# Patient Record
Sex: Female | Born: 1980 | Hispanic: No | Marital: Married | Smoking: Never smoker
Health system: Southern US, Community
[De-identification: ages and names within clinical notes are randomized; demographics above are authoritative.]

## PROBLEM LIST (undated history)

## (undated) DIAGNOSIS — M51369 Other intervertebral disc degeneration, lumbar region without mention of lumbar back pain or lower extremity pain: Secondary | ICD-10-CM

## (undated) DIAGNOSIS — M5136 Other intervertebral disc degeneration, lumbar region: Secondary | ICD-10-CM

## (undated) HISTORY — PX: TONSILLECTOMY: SUR1361

---

## 2000-06-07 ENCOUNTER — Inpatient Hospital Stay (HOSPITAL_COMMUNITY): Admission: AD | Admit: 2000-06-07 | Discharge: 2000-06-08 | Payer: Self-pay | Admitting: Obstetrics & Gynecology

## 2000-08-10 ENCOUNTER — Encounter: Payer: Self-pay | Admitting: *Deleted

## 2000-08-10 ENCOUNTER — Inpatient Hospital Stay (HOSPITAL_COMMUNITY): Admission: AD | Admit: 2000-08-10 | Discharge: 2000-08-10 | Payer: Self-pay | Admitting: Obstetrics

## 2003-06-06 ENCOUNTER — Inpatient Hospital Stay (HOSPITAL_COMMUNITY): Admission: AD | Admit: 2003-06-06 | Discharge: 2003-06-09 | Payer: Self-pay | Admitting: Obstetrics and Gynecology

## 2007-01-28 ENCOUNTER — Ambulatory Visit: Payer: Self-pay | Admitting: *Deleted

## 2007-01-28 ENCOUNTER — Inpatient Hospital Stay (HOSPITAL_COMMUNITY): Admission: AD | Admit: 2007-01-28 | Discharge: 2007-01-30 | Payer: Self-pay | Admitting: Gynecology

## 2011-04-20 LAB — HEPATITIS B SURFACE ANTIGEN: Hepatitis B Surface Ag: NEGATIVE

## 2011-04-20 LAB — CBC
HCT: 34.8 — ABNORMAL LOW
Hemoglobin: 11.8 — ABNORMAL LOW
MCHC: 33.8
MCV: 77.1 — ABNORMAL LOW
Platelets: 197
RBC: 4.51
RDW: 14.5 — ABNORMAL HIGH
WBC: 10.5

## 2011-04-20 LAB — DIFFERENTIAL
Basophils Absolute: 0
Basophils Relative: 0
Eosinophils Absolute: 0
Eosinophils Relative: 0
Lymphocytes Relative: 18
Lymphs Abs: 1.9
Monocytes Absolute: 0.6
Monocytes Relative: 6
Neutro Abs: 7.9 — ABNORMAL HIGH
Neutrophils Relative %: 76

## 2011-04-20 LAB — TYPE AND SCREEN
ABO/RH(D): A POS
Antibody Screen: POSITIVE
Donor AG Type: NEGATIVE

## 2011-04-20 LAB — URINALYSIS, ROUTINE W REFLEX MICROSCOPIC
Bilirubin Urine: NEGATIVE
Nitrite: NEGATIVE
Specific Gravity, Urine: 1.01
Urobilinogen, UA: 0.2
pH: 6.5

## 2011-04-20 LAB — RPR: RPR Ser Ql: NONREACTIVE

## 2011-04-20 LAB — RAPID URINE DRUG SCREEN, HOSP PERFORMED
Barbiturates: NOT DETECTED
Opiates: NOT DETECTED
Tetrahydrocannabinol: NOT DETECTED

## 2011-04-20 LAB — RAPID HIV SCREEN (WH-MAU): Rapid HIV Screen: NONREACTIVE

## 2021-09-25 ENCOUNTER — Emergency Department (HOSPITAL_BASED_OUTPATIENT_CLINIC_OR_DEPARTMENT_OTHER)
Admission: EM | Admit: 2021-09-25 | Discharge: 2021-09-25 | Disposition: A | Discharge status: Home / Self Care | Payer: Self-pay | Attending: Emergency Medicine | Admitting: Emergency Medicine

## 2021-09-25 ENCOUNTER — Emergency Department (HOSPITAL_BASED_OUTPATIENT_CLINIC_OR_DEPARTMENT_OTHER): Payer: Self-pay

## 2021-09-25 ENCOUNTER — Encounter (HOSPITAL_BASED_OUTPATIENT_CLINIC_OR_DEPARTMENT_OTHER): Payer: Self-pay | Admitting: Urology

## 2021-09-25 DIAGNOSIS — S8992XA Unspecified injury of left lower leg, initial encounter: Secondary | ICD-10-CM | POA: Diagnosis not present

## 2021-09-25 DIAGNOSIS — S199XXA Unspecified injury of neck, initial encounter: Secondary | ICD-10-CM | POA: Insufficient documentation

## 2021-09-25 DIAGNOSIS — S8991XA Unspecified injury of right lower leg, initial encounter: Secondary | ICD-10-CM | POA: Insufficient documentation

## 2021-09-25 DIAGNOSIS — S79911A Unspecified injury of right hip, initial encounter: Secondary | ICD-10-CM | POA: Insufficient documentation

## 2021-09-25 DIAGNOSIS — S4991XA Unspecified injury of right shoulder and upper arm, initial encounter: Secondary | ICD-10-CM | POA: Insufficient documentation

## 2021-09-25 DIAGNOSIS — S299XXA Unspecified injury of thorax, initial encounter: Secondary | ICD-10-CM | POA: Insufficient documentation

## 2021-09-25 DIAGNOSIS — Y92410 Unspecified street and highway as the place of occurrence of the external cause: Secondary | ICD-10-CM | POA: Insufficient documentation

## 2021-09-25 HISTORY — DX: Other intervertebral disc degeneration, lumbar region without mention of lumbar back pain or lower extremity pain: M51.369

## 2021-09-25 HISTORY — DX: Other intervertebral disc degeneration, lumbar region: M51.36

## 2021-09-25 MED ORDER — LIDOCAINE 5 % EX PTCH: 1.0000 | MEDICATED_PATCH | CUTANEOUS | 0 refills | Status: DC

## 2021-09-25 MED ORDER — METHOCARBAMOL 500 MG PO TABS: 500.0000 mg | ORAL_TABLET | Freq: Two times a day (BID) | ORAL | 0 refills | Status: DC

## 2021-09-25 MED ORDER — METHOCARBAMOL 500 MG PO TABS
500.0000 mg | ORAL_TABLET | Freq: Once | ORAL | Status: AC
  Administered 2021-09-25: 500 mg via ORAL
  Filled 2021-09-25: qty 1

## 2021-09-25 MED ORDER — LIDOCAINE 5 % EX PTCH
1.0000 | MEDICATED_PATCH | CUTANEOUS | Status: DC
  Administered 2021-09-25: 1 via TRANSDERMAL
  Filled 2021-09-25: qty 1

## 2021-09-25 MED ORDER — IBUPROFEN 400 MG PO TABS
600.0000 mg | ORAL_TABLET | Freq: Once | ORAL | Status: AC
  Administered 2021-09-25: 600 mg via ORAL
  Filled 2021-09-25: qty 1
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
## 2021-10-11 ENCOUNTER — Encounter (HOSPITAL_BASED_OUTPATIENT_CLINIC_OR_DEPARTMENT_OTHER): Payer: Self-pay | Admitting: Emergency Medicine

## 2021-10-11 ENCOUNTER — Emergency Department (HOSPITAL_BASED_OUTPATIENT_CLINIC_OR_DEPARTMENT_OTHER): Payer: Self-pay

## 2021-10-11 ENCOUNTER — Emergency Department (HOSPITAL_BASED_OUTPATIENT_CLINIC_OR_DEPARTMENT_OTHER)
Admission: EM | Admit: 2021-10-11 | Discharge: 2021-10-11 | Disposition: A | Discharge status: Home / Self Care | Payer: Self-pay | Attending: Emergency Medicine | Admitting: Emergency Medicine

## 2021-10-11 ENCOUNTER — Other Ambulatory Visit: Payer: Self-pay

## 2021-10-11 DIAGNOSIS — R0602 Shortness of breath: Secondary | ICD-10-CM | POA: Insufficient documentation

## 2021-10-11 DIAGNOSIS — S4991XA Unspecified injury of right shoulder and upper arm, initial encounter: Secondary | ICD-10-CM | POA: Diagnosis not present

## 2021-10-11 DIAGNOSIS — Y92410 Unspecified street and highway as the place of occurrence of the external cause: Secondary | ICD-10-CM | POA: Insufficient documentation

## 2021-10-11 DIAGNOSIS — S199XXA Unspecified injury of neck, initial encounter: Secondary | ICD-10-CM | POA: Insufficient documentation

## 2021-10-11 DIAGNOSIS — R051 Acute cough: Secondary | ICD-10-CM | POA: Diagnosis not present

## 2021-10-11 DIAGNOSIS — Z20822 Contact with and (suspected) exposure to covid-19: Secondary | ICD-10-CM | POA: Diagnosis not present

## 2021-10-11 DIAGNOSIS — R0789 Other chest pain: Secondary | ICD-10-CM

## 2021-10-11 DIAGNOSIS — S299XXA Unspecified injury of thorax, initial encounter: Secondary | ICD-10-CM | POA: Diagnosis present

## 2021-10-11 LAB — CBC WITH DIFFERENTIAL/PLATELET
Abs Immature Granulocytes: 0.01 10*3/uL (ref 0.00–0.07)
Basophils Absolute: 0.1 10*3/uL (ref 0.0–0.1)
Basophils Relative: 1 %
Eosinophils Absolute: 0.1 10*3/uL (ref 0.0–0.5)
Eosinophils Relative: 2 %
HCT: 36.1 % (ref 36.0–46.0)
Hemoglobin: 12.4 g/dL (ref 12.0–15.0)
Immature Granulocytes: 0 %
Lymphocytes Relative: 36 %
Lymphs Abs: 2.7 10*3/uL (ref 0.7–4.0)
MCH: 27.9 pg (ref 26.0–34.0)
MCHC: 34.3 g/dL (ref 30.0–36.0)
MCV: 81.1 fL (ref 80.0–100.0)
Monocytes Absolute: 0.7 10*3/uL (ref 0.1–1.0)
Monocytes Relative: 10 %
Neutro Abs: 3.8 10*3/uL (ref 1.7–7.7)
Neutrophils Relative %: 51 %
Platelets: 249 10*3/uL (ref 150–400)
RBC: 4.45 MIL/uL (ref 3.87–5.11)
RDW: 12.9 % (ref 11.5–15.5)
WBC: 7.5 10*3/uL (ref 4.0–10.5)
nRBC: 0 % (ref 0.0–0.2)

## 2021-10-11 LAB — BASIC METABOLIC PANEL
Anion gap: 7 (ref 5–15)
BUN: 14 mg/dL (ref 6–20)
CO2: 23 mmol/L (ref 22–32)
Calcium: 8.6 mg/dL — ABNORMAL LOW (ref 8.9–10.3)
Chloride: 108 mmol/L (ref 98–111)
Creatinine, Ser: 0.67 mg/dL (ref 0.44–1.00)
GFR, Estimated: >60 mL/min (ref >60)
Glucose, Bld: 127 mg/dL — ABNORMAL HIGH (ref 70–99)
Potassium: 3.7 mmol/L (ref 3.5–5.1)
Sodium: 138 mmol/L (ref 135–145)

## 2021-10-11 LAB — RESP PANEL BY RT-PCR (FLU A&B, COVID) ARPGX2
Influenza A by PCR: NEGATIVE
Influenza B by PCR: NEGATIVE
SARS Coronavirus 2 by RT PCR: NEGATIVE

## 2021-10-11 MED ORDER — NAPROXEN 500 MG PO TABS: 500.0000 mg | ORAL_TABLET | Freq: Two times a day (BID) | ORAL | 0 refills | Status: DC

## 2021-10-11 MED ORDER — IOHEXOL 300 MG/ML  SOLN
80.0000 mL | Freq: Once | INTRAMUSCULAR | Status: AC | PRN
  Administered 2021-10-11: 80 mL via INTRAVENOUS

## 2021-10-11 MED ORDER — KETOROLAC TROMETHAMINE 30 MG/ML IJ SOLN
30.0000 mg | Freq: Once | INTRAMUSCULAR | Status: AC
  Administered 2021-10-11: 30 mg via INTRAVENOUS
  Filled 2021-10-11: qty 1

## 2021-10-11 MED ORDER — METHOCARBAMOL 500 MG PO TABS: 500.0000 mg | ORAL_TABLET | Freq: Two times a day (BID) | ORAL | 0 refills | Status: DC
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
## 2021-10-13 ENCOUNTER — Ambulatory Visit: Payer: Self-pay | Attending: Emergency Medicine | Admitting: Physical Therapy

## 2021-10-13 ENCOUNTER — Encounter: Payer: Self-pay | Admitting: Physical Therapy

## 2021-10-13 DIAGNOSIS — M542 Cervicalgia: Secondary | ICD-10-CM | POA: Insufficient documentation

## 2021-10-13 DIAGNOSIS — R29898 Other symptoms and signs involving the musculoskeletal system: Secondary | ICD-10-CM | POA: Insufficient documentation

## 2021-10-13 DIAGNOSIS — R252 Cramp and spasm: Secondary | ICD-10-CM | POA: Insufficient documentation

## 2021-10-13 NOTE — Patient Instructions (Signed)
Access Code: [facility] ?URL: [email or web address] ?Date: 10/13/2021 ?Prepared by: Raynelle Fanning ? ?Exercises ?- Seated Cervical Rotation AROM  - 2 x daily - 7 x weekly - 1 sets - 10 reps - 5 sec hold ?- Seated Cervical Sidebending AROM  - 2 x daily - 7 x weekly - 1 sets - 10 reps - 5 sec hold ?- Seated Cervical Flexion AROM  - 2 x daily - 7 x weekly - 1 sets - 10 reps - 5 sec hold ?- Seated Cervical Extension AROM  - 2 x daily - 7 x weekly - 1 sets - 10 reps - 5 sec hold ?- Seated Cervical Retraction  - 2 x daily - 7 x weekly - 1 sets - 10 reps - 5 sec hold ?- Doorway Pec Stretch at 60 Degrees Abduction with Arm Straight  - 2 x daily - 7 x weekly - 1 sets - 3 reps - 30 sec hold ?- Doorway Pec Stretch at 60 Elevation  - 2 x daily - 7 x weekly - 1 sets - 3 reps - 30 sec hold ?
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
## 2021-10-16 ENCOUNTER — Ambulatory Visit: Payer: Self-pay | Admitting: Rehabilitative and Restorative Service Providers"

## 2021-10-16 ENCOUNTER — Encounter: Payer: Self-pay | Admitting: Physical Therapy

## 2021-10-16 ENCOUNTER — Ambulatory Visit: Payer: Self-pay | Admitting: Physical Therapy

## 2021-10-16 DIAGNOSIS — R29898 Other symptoms and signs involving the musculoskeletal system: Secondary | ICD-10-CM

## 2021-10-16 DIAGNOSIS — R252 Cramp and spasm: Secondary | ICD-10-CM

## 2021-10-16 DIAGNOSIS — M542 Cervicalgia: Secondary | ICD-10-CM

## 2021-10-16 NOTE — Therapy (Signed)
[place] [facility] [address] ?[place], [place], [address] ?Phone: [phone]   Fax:  [phone] ? ?Physical Therapy Treatment ? ?Patient Details  ?Name: Amanda French ?MRN: [id number] ?Date of Birth: [birth year] ?Referring Provider (PT): Calvert Cantor, MD ? ? ?Encounter Date: 10/16/2021 ? ? PT End of Session - 10/16/21 1430   ? ? Visit Number 2   ? Date for PT Re-Evaluation 12/08/21   ? PT Start Time [id number]   ? PT Stop Time 1402   ? PT Time Calculation (min) 41 min   ? Activity Tolerance Patient tolerated treatment well   ? Behavior During Therapy [facility] for tasks assessed/performed   ? ?  ?  ? ?  ? ? ?Past Medical History:  ?Diagnosis Date  ? DDD (degenerative disc disease), lumbar   ? ? ?Past Surgical History:  ?Procedure Laterality Date  ? TONSILLECTOMY    ? ? ?There were no vitals filed for this visit. ? ? Subjective Assessment - 10/16/21 1325   ? ? Subjective Still having a lot of severe pain when coughing and sneezing, mm relaxers help the most   ? Patient Stated Goals to get rid of pain   ? Currently in Pain? Yes   ? Pain Score 4    ? ?  ?  ? ?  ? ? ? ? ? ? ? ? ? ? ? ? ? ? ? ? ? ? ? ? [facility] Adult PT Treatment/Exercise - 10/16/21 0001   ? ?  ? Neck Exercises: Machines for Strengthening  ? UBE (Upper Arm Bike) L1 3 min forward, 3 min backward   ?  ? Neck Exercises: Seated  ? Neck Retraction 15 reps   ? Neck Retraction Limitations 3 second   ? Cervical Rotation 10 reps   ? Cervical Rotation Limitations with chin tuck   ? Lateral Flexion 10 reps   ? Lateral Flexion Limitations with chin tuck   ? Other Seated Exercise thoracic excursions 1x10 all directions   ?  ? Neck Exercises: Supine  ? Other Supine Exercise thoracic extension stretch over foam roll 1x60 seconds   ?  ? Manual Therapy  ? Manual Therapy Soft tissue mobilization   ? Soft tissue mobilization suboccipitals and cervical paraspinals   ? ?  ?  ? ?  ? ? ? ? ? ? ? ? ? ? PT Education - 10/16/21 1430   ? ?  Education Details education on anatomy of area, how PT interventions can help, exercise form, HEP updates, heat vs ice   ? Person(s) Educated Patient   ? Methods Explanation   ? Comprehension Verbalized understanding   ? ?  ?  ? ?  ? ? ? PT Short Term Goals - 10/13/21 1318   ? ?  ? PT SHORT TERM GOAL #1  ? Title Ind with initial HEP   ? Time 2   ? Period Weeks   ? Status New   ? Target Date 10/27/21   ?  ? PT SHORT TERM GOAL #2  ? Title Complete neck assessment   ? Time 1   ? Period Weeks   ? Status New   ? Target Date 10/20/21   ? ?  ?  ? ?  ? ? ? ? PT Long Term Goals - 10/13/21 1320   ? ?  ? PT LONG TERM GOAL #1  ? Title ind with HEP and progression   ? Time 8   ?  Period Weeks   ? Status New   ? Target Date 12/08/21   ?  ? PT LONG TERM GOAL #2  ? Title decreased neck and chest pain by >= 90% with ADLs including lifting and carrying.   ? Time 8   ? Period Weeks   ? Status New   ?  ? PT LONG TERM GOAL #3  ? Title improved neck ROM to [facility] to peform ADLS   ? Time 8   ? Period Weeks   ? Status New   ?  ? PT LONG TERM GOAL #4  ? Title Patient able to sleep without feeling N/T in bil UE   ? Time 8   ? Period Weeks   ? Status New   ? ?  ?  ? ?  ? ? ? ? ? ? ? ? Plan - 10/16/21 1430   ? ? Clinical Impression Statement Amanda French arrives today, still having a lot of pain. We warmed up on the UBE, then worked on cervical and thoracic mobility, she had a lot of questions about anatomy of area affected and why she is having so much pain when coughing. Finished with STM, also updated HEP. Will continue to progress as able and tolerated.   ? Examination-Activity Limitations Carry;Lift   ? Stability/Clinical Decision Making Stable/Uncomplicated   ? Clinical Decision Making Low   ? Rehab Potential Excellent   ? PT Frequency 2x / week   ? PT Duration 8 weeks   ? PT Treatment/Interventions ADLs/Self Care Home Management;Cryotherapy;Electrical Stimulation;Moist Heat;Traction;Neuromuscular re-education;Therapeutic exercise;Therapeutic  activities;Patient/family education;Manual techniques;Dry needling;Taping   ? PT Next Visit Plan continue working on general mobility, check ribs? progress movement in general   ? PT Home Exercise Plan [facility]   ? Consulted and Agree with Plan of Care Patient   ? ?  ?  ? ?  ? ? ?Patient will benefit from skilled therapeutic intervention in order to improve the following deficits and impairments:  Decreased range of motion, Impaired UE functional use, Increased muscle spasms, Pain, Decreased activity tolerance, Decreased strength, Impaired flexibility ? ?Visit Diagnosis: ?Cervicalgia ? ?Cramp and spasm ? ?Other symptoms and signs involving the musculoskeletal system ? ? ? ? ?Problem List ?There are no problems to display for this patient. ? ?Costella Schwarz U PT, DPT, PN2  ? ?Supplemental Physical Therapist ?[place]  ? ? ? ? ? ?[place] ?[place] ?[address] ?[place], [place], [address] ?Phone: [phone]   Fax:  [phone] ? ?Name: Amanda French ?MRN: [id number] ?Date of Birth: [birth year] ? ? ? ?

## 2021-10-28 ENCOUNTER — Encounter: Payer: Self-pay | Admitting: Physical Therapy

## 2021-10-28 ENCOUNTER — Ambulatory Visit: Payer: Self-pay | Admitting: Physical Therapy

## 2021-10-28 DIAGNOSIS — R29898 Other symptoms and signs involving the musculoskeletal system: Secondary | ICD-10-CM

## 2021-10-28 DIAGNOSIS — M542 Cervicalgia: Secondary | ICD-10-CM

## 2021-10-28 DIAGNOSIS — R252 Cramp and spasm: Secondary | ICD-10-CM
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
## 2021-10-29 ENCOUNTER — Encounter: Payer: Self-pay | Admitting: Physical Therapy

## 2021-10-29 ENCOUNTER — Ambulatory Visit: Payer: Self-pay | Admitting: Physical Therapy

## 2021-10-29 DIAGNOSIS — M542 Cervicalgia: Secondary | ICD-10-CM

## 2021-10-29 DIAGNOSIS — R252 Cramp and spasm: Secondary | ICD-10-CM

## 2021-10-29 DIAGNOSIS — R29898 Other symptoms and signs involving the musculoskeletal system: Secondary | ICD-10-CM

## 2021-10-29 NOTE — Therapy (Signed)
[place] ?[facility] ?[id number] ?[place], [place], [address] ?Phone: [phone]   Fax:  [phone] ? ?Physical Therapy Treatment ? ?Patient Details  ?Name: Amanda French ?MRN: [id number] ?Date of Birth: 1981-06-30 ?Referring Provider (PT): Susy Frizzleharles Sheldon, MD ? ? ?Encounter Date: 10/29/2021 ? ? PT End of Session - 10/29/21 1537   ? ? Visit Number 4   ? Date for PT Re-Evaluation 12/08/21   ? PT Start Time 1322   ? PT Stop Time 1416   heat not included in billing  ? PT Time Calculation (min) 54 min   ? Activity Tolerance Patient tolerated treatment well   ? Behavior [facility] for tasks assessed/performed   ? ?  ?  ? ?  ? ? ?Past Medical History:  ?Diagnosis Date  ? DDD (degenerative disc disease), lumbar   ? ? ?Past Surgical History:  ?Procedure Laterality Date  ? TONSILLECTOMY    ? ? ?There were no vitals filed for this visit. ? ? Subjective Assessment - 10/29/21 1324   ? ? Subjective Didn't have a chance to try new exercises yet, very busy yesterday; seeing Dr. Denyse Amassorey tomorrow. The only thing that helps sometimes is when she takes a warm shower. Would like to know if any vitamins like B6 or B12 will help her pain.   ? Patient is accompained by: Interpreter   ? Patient Stated Goals to get rid of pain   ? Currently in Pain? Yes   ? Pain Score 4    ? Pain Location --   neck, shoulders, hand  ? Pain Orientation Right;Left   ? ?  ?  ? ?  ? ? ? ? ? ? ? ? ? ? ? ? ? ? ? ? ? ? ? ? [facility] Adult PT Treatment/Exercise - 10/29/21 0001   ? ?  ? Neck Exercises: Supine  ? Other Supine Exercise pec stretches on door way 2x15 seconds   ?  ? Neck Exercises: Stretches  ? Upper Trapezius Stretch Left;Right;3 reps;30 seconds   ?  ? Manual Therapy  ? Manual Therapy Soft tissue mobilization;Myofascial release;Manual Traction   ? Soft tissue mobilization suboccipital release, cervical paraspinals, B upper traps and scalenes   ? Myofascial Release L UT MFR, suboccipital release   ? Manual  Traction gentle manual traction to cervical spine   ? ?  ?  ? ?  ?UBE L3 x3 min forward, 3 min backward  ? ? ? ? ? ? ? ? ? PT Education - 10/29/21 1536   ? ? Education Details Spent a lot of time discussing role of chronic guarding and stress in chronic muscle spasm and pain as well as anatomy of region involved.   ? Person(s) Educated Patient;Child(ren)   ? Methods Explanation   ? Comprehension Verbalized understanding   ? ?  ?  ? ?  ? ? ? PT Short Term Goals - 10/28/21 1446   ? ?  ? PT SHORT TERM GOAL #1  ? Title Ind with initial HEP   ? Time 2   ? Period Weeks   ? Status New   ?  ? PT SHORT TERM GOAL #2  ? Title Complete neck assessment   ? Period Weeks   ? Status New   ? Target Date 10/20/21   ? ?  ?  ? ?  ? ? ? ? PT Long Term Goals - 10/28/21 1446   ? ?  ? PT  LONG TERM GOAL #1  ? Title ind with HEP and progression   ? Time 8   ? Period Weeks   ? Status New   ? Target Date 12/08/21   ?  ? PT LONG TERM GOAL #2  ? Title decreased neck and chest pain by >= 90% with ADLs including lifting and carrying.   ? Time 8   ? Period Weeks   ? Status New   ?  ? PT LONG TERM GOAL #3  ? Title improved neck ROM to [facility] to peform ADLS   ? Time 8   ? Period Weeks   ? Status New   ?  ? PT LONG TERM GOAL #4  ? Title Patient able to sleep without feeling N/T in bil UE   ? Time 8   ? Period Weeks   ? Status New   ?  ? PT LONG TERM GOAL #5  ? Title BUE strength to improve to 5/5 and she will be able to complete functional cooking and home keeping tasks with pain no more than 4/10   ? Time 8   ? Period Weeks   ? Status New   ? ?  ?  ? ?  ? ? ? ? ? ? ? ? Plan - 10/29/21 1537   ? ? Clinical Impression Statement [name] arrives today still having quite a bit of pain in her neck, shoulders, and arms; we warmed up on the UBE, from there I focused today?s session on manual techniques and interventions. Tried manual cervical traction today, this was limited by suboccipital and cervical paraspinal soft tissue pain so I shifted gears to MFR, STM,  and mm stretching. Able to get some release but not enough to reduce pain in sitting, so put her on a heat pack and followed this with further STM to UTs, levators, cervical paraspinals, and scalenes. Able to get further mm release and improvement in pain when we got her back to sitting from supine. Also added 3 way doorway pec stretch to HEP. Spent a lot of time discussing role of chronic guarding and stress in chronic muscle spasm and pain as well as anatomy of region involved. I think we found some interventions that will really help her moving forward, but I do think she?ll benefit from DN as well- will reach out to [facility] clinic as they are familiar with that location.   ? Examination-Activity Limitations Carry;Lift   ? Stability/Clinical Decision Making Stable/Uncomplicated   ? Clinical Decision Making Low   ? Rehab Potential Excellent   ? PT Frequency 2x / week   ? PT Duration 8 weeks   ? PT Treatment/Interventions ADLs/Self Care Home Management;Cryotherapy;Electrical Stimulation;Moist Heat;Traction;Neuromuscular re-education;Therapeutic exercise;Therapeutic activities;Patient/family education;Manual techniques;Dry needling;Taping   ? PT Next Visit Plan continue working on general mobility, check ribs? progress movement in general, tolerates activities better in supine for now   ? PT Home Exercise Plan [facility]   ? Consulted and Agree with Plan of Care Patient   ? ?  ?  ? ?  ? ? ?Patient will benefit from skilled therapeutic intervention in order to improve the following deficits and impairments:  Decreased range of motion, Impaired UE functional use, Increased muscle spasms, Pain, Decreased activity tolerance, Decreased strength, Impaired flexibility ? ?Visit Diagnosis: ?Cervicalgia ? ?Cramp and spasm ? ?Other symptoms and signs involving the musculoskeletal system ? ? ? ? ?Problem List ?There are no problems to display for this patient. ? ?Lerry Liner PT, DPT,  PN2  ? ?Supplemental Physical  Therapist ?[place]  ? ? ? ? ? ?[place] ?[facility] ?[address] ?[place], [place], [address] ?Phone: [phone]   Fax:  [phone] ? ?Name: Mushka Laconte ?MRN: [id number] ?Date of Birth: 1980/08/01 ? ? ? ?

## 2021-10-30 ENCOUNTER — Encounter: Payer: Self-pay | Admitting: Family Medicine

## 2021-10-30 ENCOUNTER — Ambulatory Visit (INDEPENDENT_AMBULATORY_CARE_PROVIDER_SITE_OTHER): Payer: Self-pay

## 2021-10-30 ENCOUNTER — Ambulatory Visit (INDEPENDENT_AMBULATORY_CARE_PROVIDER_SITE_OTHER): Payer: Self-pay | Admitting: Family Medicine

## 2021-10-30 VITALS — BP 110/80 | HR 82 | Ht 64.0 in | Wt 179.6 lb

## 2021-10-30 DIAGNOSIS — R29898 Other symptoms and signs involving the musculoskeletal system: Secondary | ICD-10-CM

## 2021-10-30 DIAGNOSIS — M5412 Radiculopathy, cervical region: Secondary | ICD-10-CM

## 2021-10-30 DIAGNOSIS — M25531 Pain in right wrist: Secondary | ICD-10-CM
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
## 2021-10-30 NOTE — Patient Instructions (Addendum)
Nice to meet you today. ? ?Follow-up: after MRI.  ? ?Please get an Xray today before you leave  ? ?Please use Voltaren gel (Generic Diclofenac Gel) up to 4x daily for pain as needed.  This is available over-the-counter as both the name brand Voltaren gel and the generic diclofenac gel.  ? ? ?

## 2021-11-03 NOTE — Progress Notes (Signed)
Wrist x-ray looks normal to radiology

## 2021-11-04 ENCOUNTER — Ambulatory Visit: Payer: Self-pay | Attending: Emergency Medicine | Admitting: Physical Therapy

## 2021-11-04 ENCOUNTER — Encounter: Payer: Self-pay | Admitting: Physical Therapy

## 2021-11-04 DIAGNOSIS — R29898 Other symptoms and signs involving the musculoskeletal system: Secondary | ICD-10-CM | POA: Insufficient documentation

## 2021-11-04 DIAGNOSIS — M542 Cervicalgia: Secondary | ICD-10-CM | POA: Insufficient documentation

## 2021-11-04 DIAGNOSIS — R252 Cramp and spasm: Secondary | ICD-10-CM | POA: Insufficient documentation

## 2021-11-04 NOTE — Therapy (Signed)
[place] ?[facility] ?[address] ?[place], [place], [address] ?Phone: [phone]   Fax:  [phone] ? ?Physical Therapy Treatment ? ?Patient Details  ?Name: Amanda French ?MRN: [id number] ?Date of Birth: 16-Apr-1981 ?Referring Provider (PT): Susy Frizzleharles Sheldon, MD ? ? ?Encounter Date: 11/04/2021 ? ? PT End of Session - 11/04/21 1441   ? ? Visit Number 5   ? Date for PT Re-Evaluation 12/08/21   ? PT Start Time 1324   arrived late  ? PT Stop Time 1400   ? PT Time Calculation (min) 36 min   ? Activity Tolerance Patient tolerated treatment well   ? Behavior [facility] for tasks assessed/performed   ? ?  ?  ? ?  ? ? ?Past Medical History:  ?Diagnosis Date  ? DDD (degenerative disc disease), lumbar   ? ? ?Past Surgical History:  ?Procedure Laterality Date  ? TONSILLECTOMY    ? ? ?There were no vitals filed for this visit. ? ? Subjective Assessment - 11/04/21 1328   ? ? Subjective Pain is better than before, had to drive on the highway for awhile and noticed more numbness in R UE. Its becoming less pain in the arms over the past couple days and more just weakness.   ? Patient is accompained by: Interpreter   ? Diagnostic tests XR: Lower cervical spondylosis and facet hypertrophy; Chest CT negative, Rt shoulder xray negative   ? Patient Stated Goals to get rid of pain   ? Currently in Pain? Yes   ? Pain Score 4    ? ?  ?  ? ?  ? ? ? ? ? ? ? ? ? ? ? ? ? ? ? ? ? ? ? ? [facility] Adult PT Treatment/Exercise - 11/04/21 0001   ? ?  ? Neck Exercises: Supine  ? Neck Retraction 10 reps;5 secs   ? Neck Retraction Limitations manual facilitation of good form   ?  ? Neck Exercises: Stretches  ? Upper Trapezius Stretch Right;Left;3 reps;30 seconds   ? Upper Trapezius Stretch Limitations with GH depression (supine)   ? Levator Stretch Right;Left;2 reps;30 seconds   ?  ? Manual Therapy  ? Manual Therapy Soft tissue mobilization   ? Soft tissue mobilization suboccipital release   ? Manual Traction  gentle manual traction, able to tolerate a little more tension today   ? ?  ?  ? ?  ? ? ? ? ? ? ? ? ? ? PT Education - 11/04/21 1441   ? ? Education Details Encouraged continued use of moist heat, also discussed use of neck pillow/additional pillows to make sure her neck is in a neutral position when she is sleeping   ? Person(s) Educated Patient;Child(ren)   ? Methods Explanation   ? Comprehension Verbalized understanding   ? ?  ?  ? ?  ? ? ? PT Short Term Goals - 10/28/21 1446   ? ?  ? PT SHORT TERM GOAL #1  ? Title Ind with initial HEP   ? Time 2   ? Period Weeks   ? Status New   ?  ? PT SHORT TERM GOAL #2  ? Title Complete neck assessment   ? Period Weeks   ? Status New   ? Target Date 10/20/21   ? ?  ?  ? ?  ? ? ? ? PT Long Term Goals - 10/28/21 1446   ? ?  ? PT LONG TERM GOAL #  1  ? Title ind with HEP and progression   ? Time 8   ? Period Weeks   ? Status New   ? Target Date 12/08/21   ?  ? PT LONG TERM GOAL #2  ? Title decreased neck and chest pain by >= 90% with ADLs including lifting and carrying.   ? Time 8   ? Period Weeks   ? Status New   ?  ? PT LONG TERM GOAL #3  ? Title improved neck ROM to [facility] to peform ADLS   ? Time 8   ? Period Weeks   ? Status New   ?  ? PT LONG TERM GOAL #4  ? Title Patient able to sleep without feeling N/T in bil UE   ? Time 8   ? Period Weeks   ? Status New   ?  ? PT LONG TERM GOAL #5  ? Title BUE strength to improve to 5/5 and she will be able to complete functional cooking and home keeping tasks with pain no more than 4/10   ? Time 8   ? Period Weeks   ? Status New   ? ?  ?  ? ?  ? ? ? ? ? ? ? ? Plan - 11/04/21 1441   ? ? Clinical Impression Statement Amanda French arrives today feeling better after what we did last session, she tells me they have not gotten a call about the cervical MRI just yet. She has been using moist heat at home consistently. Noted much improved mm tension and spasms today, she was able to tolerate a lot more physical movement and increased tension on manual  cervical traction today as well but still had to proceed cautiously as it did still cause some pain after a couple of repetitions. Able to introduce more physical exercise today with fatigue only and no increased pain. Encouraged continued use of moist heat, also discussed use of neck pillow/additional pillows to make sure her neck is in a neutral position when she is sleeping; her husband is bringing her a self cervical traction unit that goes over the door, we will plan on trying this together when she has it to make sure she can use it safely. Having some pain in the pec areas, will plan on assessing this further tomorrow. I?m glad we have been able to help her start feeling better.   ? Examination-Activity Limitations Carry;Lift   ? Stability/Clinical Decision Making Stable/Uncomplicated   ? Clinical Decision Making Low   ? Rehab Potential Excellent   ? PT Frequency 2x / week   ? PT Duration 8 weeks   ? PT Treatment/Interventions ADLs/Self Care Home Management;Cryotherapy;Electrical Stimulation;Moist Heat;Traction;Neuromuscular re-education;Therapeutic exercise;Therapeutic activities;Patient/family education;Manual techniques;Dry needling;Taping   ? PT Next Visit Plan assess pec issues, progress movement in general STM PRN   ? PT Home Exercise Plan [facility]   ? Consulted and Agree with Plan of Care Patient   ? ?  ?  ? ?  ? ? ?Patient will benefit from skilled therapeutic intervention in order to improve the following deficits and impairments:  Decreased range of motion, Impaired UE functional use, Increased muscle spasms, Pain, Decreased activity tolerance, Decreased strength, Impaired flexibility ? ?Visit Diagnosis: ?Cervicalgia ? ?Cramp and spasm ? ?Other symptoms and signs involving the musculoskeletal system ? ? ? ? ?Problem List ?There are no problems to display for this patient. ? ?Amanda French U PT, DPT, PN2  ? ?Supplemental Physical Therapist ?[place]  ? ? ? ? ? ?  [place] [facility] ?[address] ?[place], [place], [address] ?Phone: [phone]   Fax:  [phone] ? ?Name: Amanda French ?MRN: [id number] ?Date of Birth: 1980-12-19 ? ? ? ?

## 2021-11-05 ENCOUNTER — Encounter: Payer: Self-pay | Admitting: Physical Therapy

## 2021-11-05 ENCOUNTER — Ambulatory Visit: Payer: Self-pay | Admitting: Physical Therapy

## 2021-11-05 DIAGNOSIS — M542 Cervicalgia: Secondary | ICD-10-CM

## 2021-11-05 DIAGNOSIS — R252 Cramp and spasm: Secondary | ICD-10-CM

## 2021-11-05 DIAGNOSIS — R29898 Other symptoms and signs involving the musculoskeletal system: Secondary | ICD-10-CM

## 2021-11-05 NOTE — Therapy (Signed)
[place] ?[facility] ?[address] ?[place], [place], [address] ?Phone: [phone]   Fax:  [phone] ? ?Physical Therapy Treatment ? ?Patient Details  ?Name: Amanda French ?MRN: [id number] ?Date of Birth: Aug 23, 1980 ?Referring Provider (PT): Susy Frizzle, MD ? ? ?Encounter Date: 11/05/2021 ? ? PT End of Session - 11/05/21 1439   ? ? Visit Number 6   ? Date for PT Re-Evaluation 12/08/21   ? PT Start Time 1319   ? PT Stop Time 1425   ? PT Time Calculation (min) 66 min   ? Activity Tolerance Patient tolerated treatment well   ? Behavior During Therapy [facility] for tasks assessed/performed   ? ?  ?  ? ?  ? ? ?Past Medical History:  ?Diagnosis Date  ? DDD (degenerative disc disease), lumbar   ? ? ?Past Surgical History:  ?Procedure Laterality Date  ? TONSILLECTOMY    ? ? ?There were no vitals filed for this visit. ? ? Subjective Assessment - 11/05/21 1320   ? ? Subjective I was really tired after the session yesterday, just felt heavy and slept for one hour. No soreness. After the exercise the left side of my chest hurts, and the right side has pain going from my shoulder down to the shoulder blade. After the traction yesterday I had a lot more pain in my low back where I'm having that disc issue.   ? Patient is accompained by: Interpreter   ? Pertinent History L4/5 disc   ? Diagnostic tests XR: Lower cervical spondylosis and facet hypertrophy; Chest CT negative, Rt shoulder xray negative   ? Patient Stated Goals to get rid of pain   ? Currently in Pain? Yes   ? Pain Score 4    ? Pain Location --   neck, shoulders, hand  ? ?  ?  ? ?  ? ? ? ? ? ? ? ? ? ? ? ? ? ? ? ? ? ? ? ? [facility] Adult PT Treatment/Exercise - 11/05/21 0001   ? ?  ? Neck Exercises: Stretches  ? Upper Trapezius Stretch Right;Left;3 reps;30 seconds   ? Other Neck Stretches corner stretch 2x30 seconds   ? Other Neck Stretches scalene stretches 2x30 seconds B   ?  ? Manual Therapy  ? Manual Therapy Soft tissue  mobilization   ? Soft tissue mobilization STM to B scalenes groups L>R   ? ?  ?  ? ?  ? ? ? ? ? ? ? ? ? ? PT Education - 11/05/21 1438   ? ? Education Details anatomy of the affected areas, also pain patterns and PT interventions, encouraged use of moist heat, HEP updates   ? Person(s) Educated Patient   ? Methods Explanation   ? Comprehension Verbalized understanding   ? ?  ?  ? ?  ? ? ? PT Short Term Goals - 10/28/21 1446   ? ?  ? PT SHORT TERM GOAL #1  ? Title Ind with initial HEP   ? Time 2   ? Period Weeks   ? Status New   ?  ? PT SHORT TERM GOAL #2  ? Title Complete neck assessment   ? Period Weeks   ? Status New   ? Target Date 10/20/21   ? ?  ?  ? ?  ? ? ? ? PT Long Term Goals - 10/28/21 1446   ? ?  ? PT LONG TERM GOAL #1  ? Title  ind with HEP and progression   ? Time 8   ? Period Weeks   ? Status New   ? Target Date 12/08/21   ?  ? PT LONG TERM GOAL #2  ? Title decreased neck and chest pain by >= 90% with ADLs including lifting and carrying.   ? Time 8   ? Period Weeks   ? Status New   ?  ? PT LONG TERM GOAL #3  ? Title improved neck ROM to [facility] to peform ADLS   ? Time 8   ? Period Weeks   ? Status New   ?  ? PT LONG TERM GOAL #4  ? Title Patient able to sleep without feeling N/T in bil UE   ? Time 8   ? Period Weeks   ? Status New   ?  ? PT LONG TERM GOAL #5  ? Title BUE strength to improve to 5/5 and she will be able to complete functional cooking and home keeping tasks with pain no more than 4/10   ? Time 8   ? Period Weeks   ? Status New   ? ?  ?  ? ?  ? ? ? ? ? ? ? ? Plan - 11/05/21 1440   ? ? Clinical Impression Statement Tawonna arrives today doing OK, still having pain that goes from her left pec and around to her periscapular mm groups on the right; she also has numbness in her hands when driving and when elevating UEs. She had a lot of questions today regarding the anatomy of the affected areas, also pain patterns and PT interventions, spent a lot of time addressing these today with help of the  interpreter. Otherwise worked on functional stretches as well as STM to tight scalene groups today with some relief noted. We did not do any traction today as she reports she felt increased low back pain after cervical traction last time, not sure if they were related. I do think there may be a component of thoracic outlet in the picture too. She is going to [place] for two weeks next Monday, encouraged compliance with HEP to keep things from getting worse while she is gone.   ? Examination-Activity Limitations Carry;Lift   ? Stability/Clinical Decision Making Stable/Uncomplicated   ? Clinical Decision Making Low   ? Rehab Potential Excellent   ? PT Frequency 2x / week   ? PT Duration 8 weeks   ? PT Treatment/Interventions ADLs/Self Care Home Management;Cryotherapy;Electrical Stimulation;Moist Heat;Traction;Neuromuscular re-education;Therapeutic exercise;Therapeutic activities;Patient/family education;Manual techniques;Dry needling;Taping   ? PT Next Visit Plan how does she feel after [place] trip?   ? PT Home Exercise Plan [facility]   ? Consulted and Agree with Plan of Care Patient   ? ?  ?  ? ?  ? ? ?Patient will benefit from skilled therapeutic intervention in order to improve the following deficits and impairments:  Decreased range of motion, Impaired UE functional use, Increased muscle spasms, Pain, Decreased activity tolerance, Decreased strength, Impaired flexibility ? ?Visit Diagnosis: ?Cervicalgia ? ?Cramp and spasm ? ?Other symptoms and signs involving the musculoskeletal system ? ? ? ? ?Problem List ?There are no problems to display for this patient. ? ?Tvisha Schwoerer U PT, DPT, PN2  ? ?Supplemental Physical Therapist ?[place]  ? ? ? ? ? ?[place] ?[facility] ?[address] ?[place], [place], [address] ?Phone: [phone]   Fax:  [phone] ? ?Name: Dula Havlik ?MRN: [id number] ?Date of Birth:  03/09/1981 ? ? ? ?

## 2021-11-08 ENCOUNTER — Ambulatory Visit (INDEPENDENT_AMBULATORY_CARE_PROVIDER_SITE_OTHER): Payer: Self-pay

## 2021-11-08 ENCOUNTER — Other Ambulatory Visit: Payer: Self-pay

## 2021-11-08 DIAGNOSIS — R29898 Other symptoms and signs involving the musculoskeletal system: Secondary | ICD-10-CM

## 2021-11-08 DIAGNOSIS — M5000 Cervical disc disorder with myelopathy, unspecified cervical region: Secondary | ICD-10-CM

## 2021-11-08 DIAGNOSIS — M5412 Radiculopathy, cervical region: Secondary | ICD-10-CM

## 2021-11-08 DIAGNOSIS — M25531 Pain in right wrist: Secondary | ICD-10-CM

## 2021-11-10 ENCOUNTER — Ambulatory Visit: Payer: Self-pay | Admitting: Physical Therapy

## 2021-11-11 NOTE — Progress Notes (Signed)
MRI does show some bulging disks and some arthritis changes but no pinched nerves that we can see.  Recommend return to clinic to go over the results of the MRI and discuss treatment plan and options. ?Schedule an appointment with me soon please.

## 2021-11-12 ENCOUNTER — Ambulatory Visit: Payer: Self-pay | Admitting: Physical Therapy

## 2021-11-18 ENCOUNTER — Ambulatory Visit: Payer: Self-pay | Admitting: Physical Therapy

## 2021-11-20 ENCOUNTER — Ambulatory Visit: Payer: Self-pay | Admitting: Physical Therapy

## 2021-11-25 ENCOUNTER — Ambulatory Visit: Payer: Self-pay | Admitting: Physical Therapy

## 2021-12-02 ENCOUNTER — Ambulatory Visit: Payer: Self-pay | Admitting: Physical Therapy

## 2021-12-04 ENCOUNTER — Ambulatory Visit: Payer: Self-pay | Admitting: Physical Therapy

## 2021-12-15 ENCOUNTER — Ambulatory Visit: Payer: Self-pay | Attending: Emergency Medicine | Admitting: Physical Therapy

## 2021-12-15 ENCOUNTER — Encounter: Payer: Self-pay | Admitting: Physical Therapy

## 2021-12-15 DIAGNOSIS — M542 Cervicalgia: Secondary | ICD-10-CM | POA: Insufficient documentation

## 2021-12-15 DIAGNOSIS — R252 Cramp and spasm: Secondary | ICD-10-CM | POA: Insufficient documentation

## 2021-12-15 DIAGNOSIS — R29898 Other symptoms and signs involving the musculoskeletal system: Secondary | ICD-10-CM | POA: Insufficient documentation

## 2021-12-15 NOTE — Therapy (Signed)
[facility] [place] [facility] [address]. [place], [place], [address] Phone: [phone]   Fax:  [phone]  Physical Therapy Treatment  Patient Details  Name: Amanda French MRN: [id number] Date of Birth: 01/22/1981 Referring Provider (PT): Amanda Frizzle, MD   Encounter Date: 12/15/2021   PT End of Session - 12/15/21 1807     Visit Number 7    Date for PT Re-Evaluation 01/12/22    PT Start Time 1716    PT Stop Time 1800    PT Time Calculation (min) 44 min    Activity Tolerance Patient tolerated treatment well    Behavior During Therapy [facility] for tasks assessed/performed             Past Medical History:  Diagnosis Date   DDD (degenerative disc disease), lumbar     Past Surgical History:  Procedure Laterality Date   TONSILLECTOMY      There were no vitals filed for this visit.   Subjective Assessment - 12/15/21 1716     Subjective I'm doing better, I've been going to the gym and swimming/working out my arms. Arm strength is better than before but I still feel some shaking sensation. Pain levels depend, if I'm exhausted the pain is worse, if I'm rested it is better.    Patient is accompained by: Interpreter    Diagnostic tests XR: Lower cervical spondylosis and facet hypertrophy; Chest CT negative, Rt shoulder xray negative    Patient Stated Goals to get rid of pain    Currently in Pain? Yes    Pain Score 3    neck is 3/10, R UE can get to 6-7/10   Pain Location Other (Comment)   neck and R UE   Pain Orientation Right;Left    Pain Descriptors / Indicators Discomfort                [facility] PT Assessment - 12/15/21 0001       Assessment   Referring Provider (PT) Amanda Frizzle, MD    Onset Date/Surgical Date 09/25/21    Hand Dominance Right    Prior Therapy no      Precautions   Precautions None      Restrictions   Weight Bearing Restrictions No      Balance Screen   Has the patient had a decrease in activity  level because of a fear of falling?  No    Is the patient reluctant to leave their home because of a fear of falling?  No      Home [employer]ist information centre manager residence    Living Arrangements Spouse/significant other      AROM   Right Shoulder Internal Rotation --   severe limitation   Right Shoulder External Rotation --   FER severe limitation- base of skull   Left Shoulder Internal Rotation --   mild limitation   Left Shoulder External Rotation --   FER severe limitation   Cervical Flexion 48    Cervical Extension 50    Cervical - Right Side Bend 45    Cervical - Left Side Bend 28    Cervical - Right Rotation mild limitation    Cervical - Left Rotation mild limitation      Strength   Strength Assessment Site Shoulder;Elbow;Wrist    Right/Left Shoulder Left;Right    Right Shoulder Flexion 4-/5    Right Shoulder Extension 4-/5    Right Shoulder ABduction 4-/5    Right Shoulder  Internal Rotation 4-/5    Right Shoulder External Rotation 3+/5    Left Shoulder Flexion 4/5    Left Shoulder Extension 4+/5    Left Shoulder ABduction 4-/5    Left Shoulder Internal Rotation 4+/5    Left Shoulder External Rotation 4+/5    Right/Left Elbow Left;Right    Right Elbow Flexion 4+/5    Right Elbow Extension 4/5    Left Elbow Flexion 4+/5    Left Elbow Extension 4/5    Right/Left Wrist Left;Right      Palpation   Palpation comment very tight in R biceps, also R forearm tight and sore                                    PT Education - 12/15/21 1806     Education Details lots of education today on anatomy of affected areas, POC moving forward, f/u with Dr. Denyse Amassorey, possible ulnar nerve entrapment and PT plan to address this, general pain management strategies    Person(s) Educated Patient    Methods Explanation    Comprehension Verbalized understanding              PT Short Term Goals - 10/28/21 1446       PT SHORT TERM GOAL #1   Title  Ind with initial HEP    Time 2    Period Weeks    Status New      PT SHORT TERM GOAL #2   Title Complete neck assessment    Period Weeks    Status New    Target Date 10/20/21               PT Long Term Goals - 10/28/21 1446       PT LONG TERM GOAL #1   Title ind with HEP and progression    Time 8    Period Weeks    Status New    Target Date 12/08/21      PT LONG TERM GOAL #2   Title decreased neck and chest pain by >= 90% with ADLs including lifting and carrying.    Time 8    Period Weeks    Status New      PT LONG TERM GOAL #3   Title [facility] to [facility] to peform ADLS    Time 8    Period Weeks    Status New      PT LONG TERM GOAL #4   Title Patient able to sleep without feeling N/T in bil UE    Time 8    Period Weeks    Status New      PT LONG TERM GOAL #5   Title BUE strength to improve to 5/5 and she will be able to complete functional cooking and home keeping tasks with pain no more than 4/10    Time 8    Period Weeks    Status New                   Plan - 12/15/21 1807     Clinical Impression Statement [name] arrives today doing better, she was able to get to the gym while in [place] and is feeling better but still having some neck pain and R UE pain. Exam shows definite improvement in cervical ROM and strength, she is still having quite a bit of pain in RUE. I do think  that she may have some ulnar nerve entrapment contributing here. We will plan on trying ulnar nerve mobilizations as well as some STM to this area next session.    Examination-Activity Limitations Carry;Lift    Stability/Clinical Decision Making Stable/Uncomplicated    Clinical Decision Making Low    Rehab Potential Excellent    PT Frequency 2x / week    PT Duration 4 weeks    PT Treatment/Interventions ADLs/Self Care Home Management;Cryotherapy;Electrical Stimulation;Moist Heat;Traction;Neuromuscular re-education;Therapeutic exercise;Therapeutic  activities;Patient/family education;Manual techniques;Dry needling;Taping    PT Next Visit Plan ulnar nerve mobility R UE and STM R UE, discuss POC    PT Home Exercise Plan [facility]    Consulted and Agree with Plan of Care Patient             Patient will benefit from skilled therapeutic intervention in order to improve the following deficits and impairments:  Decreased range of motion, Impaired UE functional use, Increased muscle spasms, Pain, Decreased activity tolerance, Decreased strength, Impaired flexibility  Visit Diagnosis: Cervicalgia  Cramp and spasm  Other symptoms and signs involving the musculoskeletal system     Problem List There are no problems to display for this patient.  Lerry Liner PT, DPT, PN2   Supplemental Physical Therapist Abanda       [facility] [address] [place], [place], [address] Phone: [phone]   Fax:  [phone]  Name: Amanda French MRN: [id number] Date of Birth: [birth year]

## 2021-12-17 ENCOUNTER — Ambulatory Visit: Payer: Self-pay | Admitting: Physical Therapy

## 2021-12-17 ENCOUNTER — Encounter: Payer: Self-pay | Admitting: Physical Therapy

## 2021-12-17 DIAGNOSIS — M542 Cervicalgia: Secondary | ICD-10-CM

## 2021-12-17 DIAGNOSIS — R252 Cramp and spasm: Secondary | ICD-10-CM

## 2021-12-17 DIAGNOSIS — R29898 Other symptoms and signs involving the musculoskeletal system: Secondary | ICD-10-CM

## 2021-12-17 NOTE — Therapy (Signed)
[place]. [place], [place], [address] Phone: [phone]   Fax:  [phone]  Physical Therapy Treatment  Patient Details  Name: Amanda French MRN: [id number] Date of Birth: 08-26-1980 Referring Provider (PT): Calvert Cantor, MD   Encounter Date: 12/17/2021   PT End of Session - 12/17/21 1805     Visit Number 8    Date for PT Re-Evaluation 01/12/22    PT Start Time [id number]    PT Stop Time 1800    PT Time Calculation (min) 48 min    Activity Tolerance Patient tolerated treatment well    Behavior During Therapy [facility] for tasks assessed/performed             Past Medical History:  Diagnosis Date   DDD (degenerative disc disease), lumbar     Past Surgical History:  Procedure Laterality Date   TONSILLECTOMY      There were no vitals filed for this visit.   Subjective Assessment - 12/17/21 1713     Subjective Doing OK, lower R arm still hurting more than the shoulder.    Patient is accompained by: Interpreter    Pertinent History L4/5 disc    Patient Stated Goals to get rid of pain    Currently in Pain? Yes    Pain Score 5     Pain Location Arm    Pain Orientation Right    Pain Descriptors / Indicators Discomfort    Pain Type Acute pain                               [facility] Adult PT Treatment/Exercise - 12/17/21 0001       Neck Exercises: Standing   Other Standing Exercises gastroc stretches on doorframe 1x30 seconds B      Neck Exercises: Seated   Other Seated Exercise ulnar nerve moblity drills 2x10   added to HEP   Other Seated Exercise shoulder flexion 1kg 1x10, shoulder abduction 1 kg 1x10      Manual Therapy   Manual Therapy Soft tissue mobilization    Soft tissue mobilization STM to medial R biceps                     PT Education - 12/17/21 1804     Education Details lots of education today on gastroc stretching and diaphragmatic breathing to address  her other concerns; advised her to directly ask a medical professional instead of looking up sx on google, benefits of ulnar nerve mobility and STM, HEP updates    Person(s) Educated Patient    Methods Explanation    Comprehension Verbalized understanding              PT Short Term Goals - 10/28/21 1446       PT SHORT TERM GOAL #1   Title Ind with initial HEP    Time 2    Period Weeks    Status New      PT SHORT TERM GOAL #2   Title Complete neck assessment    Period Weeks    Status New    Target Date 10/20/21               PT Long Term Goals - 10/28/21 1446       PT LONG TERM GOAL #1   Title ind with HEP and progression    Time 8    Period  Weeks    Status New    Target Date 12/08/21      PT LONG TERM GOAL #2   Title decreased neck and chest pain by >= 90% with ADLs including lifting and carrying.    Time 8    Period Weeks    Status New      PT LONG TERM GOAL #3   Title improved neck ROM to [facility] to peform ADLS    Time 8    Period Weeks    Status New      PT LONG TERM GOAL #4   Title Patient able to sleep without feeling N/T in bil UE    Time 8    Period Weeks    Status New      PT LONG TERM GOAL #5   Title BUE strength to improve to 5/5 and she will be able to complete functional cooking and home keeping tasks with pain no more than 4/10    Time 8    Period Weeks    Status New                   Plan - 12/17/21 1806     Clinical Impression Statement Cataleyah arrives today doing, OK but had a lot of concerns about some tingling in her legs- both calves were very tight and I recommended stretching of both calves. 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 Spent time working on ulnar nerve mobility as well as STM to R biceps and shoulder strengthening. Updated HEP, will continue efforts.    Examination-Activity Limitations Carry;Lift     Stability/Clinical Decision Making Stable/Uncomplicated    Clinical Decision Making Low    Rehab Potential Excellent    PT Frequency 2x / week    PT Duration 4 weeks    PT Treatment/Interventions ADLs/Self Care Home Management;Cryotherapy;Electrical Stimulation;Moist Heat;Traction;Neuromuscular re-education;Therapeutic exercise;Therapeutic activities;Patient/family education;Manual techniques;Dry needling;Taping    PT Next Visit Plan ulnar nerve mobility R UE and STM R UE, UE strength    PT Home Exercise Plan [id number]    Consulted and Agree with Plan of Care Patient             Patient will benefit from skilled therapeutic intervention in order to improve the following deficits and impairments:  Decreased range of motion, Impaired UE functional use, Increased muscle spasms, Pain, Decreased activity tolerance, Decreased strength, Impaired flexibility  Visit Diagnosis: Cervicalgia  Cramp and spasm  Other symptoms and signs involving the musculoskeletal system     Problem List There are no problems to display for this patient.  Ann Lions PT, DPT, PN2   Supplemental Physical Therapist [place]. [place], [place], [address] Phone: [phone]   Fax:  [phone]  Name: Amanda French MRN: [id number] Date of Birth: 07-26-1980

## 2021-12-23 ENCOUNTER — Encounter: Payer: Self-pay | Admitting: Physical Therapy

## 2021-12-23 ENCOUNTER — Ambulatory Visit: Payer: Self-pay | Admitting: Physical Therapy

## 2021-12-23 ENCOUNTER — Emergency Department (HOSPITAL_BASED_OUTPATIENT_CLINIC_OR_DEPARTMENT_OTHER)
Admission: EM | Admit: 2021-12-23 | Discharge: 2021-12-24 | Disposition: A | Discharge status: Home / Self Care | Payer: Self-pay | Attending: Emergency Medicine | Admitting: Emergency Medicine

## 2021-12-23 ENCOUNTER — Encounter (HOSPITAL_BASED_OUTPATIENT_CLINIC_OR_DEPARTMENT_OTHER): Payer: Self-pay

## 2021-12-23 ENCOUNTER — Emergency Department (HOSPITAL_BASED_OUTPATIENT_CLINIC_OR_DEPARTMENT_OTHER): Payer: Self-pay

## 2021-12-23 ENCOUNTER — Other Ambulatory Visit: Payer: Self-pay

## 2021-12-23 DIAGNOSIS — R079 Chest pain, unspecified: Secondary | ICD-10-CM | POA: Insufficient documentation

## 2021-12-23 DIAGNOSIS — M542 Cervicalgia: Secondary | ICD-10-CM

## 2021-12-23 DIAGNOSIS — M7989 Other specified soft tissue disorders: Secondary | ICD-10-CM | POA: Diagnosis not present

## 2021-12-23 DIAGNOSIS — R252 Cramp and spasm: Secondary | ICD-10-CM

## 2021-12-23 DIAGNOSIS — R29898 Other symptoms and signs involving the musculoskeletal system: Secondary | ICD-10-CM

## 2021-12-23 DIAGNOSIS — M79661 Pain in right lower leg: Secondary | ICD-10-CM

## 2021-12-23 LAB — TROPONIN I (HIGH SENSITIVITY): Troponin I (High Sensitivity): <2 ng/L (ref <18)

## 2021-12-23 LAB — CBC
HCT: 36.2 % (ref 36.0–46.0)
Hemoglobin: 12.5 g/dL (ref 12.0–15.0)
MCH: 27.5 pg (ref 26.0–34.0)
MCHC: 34.5 g/dL (ref 30.0–36.0)
MCV: 79.7 fL — ABNORMAL LOW (ref 80.0–100.0)
Platelets: 234 10*3/uL (ref 150–400)
RBC: 4.54 MIL/uL (ref 3.87–5.11)
RDW: 13.1 % (ref 11.5–15.5)
WBC: 8.7 10*3/uL (ref 4.0–10.5)
nRBC: 0 % (ref 0.0–0.2)

## 2021-12-23 LAB — BASIC METABOLIC PANEL
Anion gap: 5 (ref 5–15)
BUN: 8 mg/dL (ref 6–20)
CO2: 22 mmol/L (ref 22–32)
Calcium: 8.5 mg/dL — ABNORMAL LOW (ref 8.9–10.3)
Chloride: 110 mmol/L (ref 98–111)
Creatinine, Ser: 0.72 mg/dL (ref 0.44–1.00)
GFR, Estimated: >60 mL/min (ref >60)
Glucose, Bld: 94 mg/dL (ref 70–99)
Potassium: 4.3 mmol/L (ref 3.5–5.1)
Sodium: 137 mmol/L (ref 135–145)

## 2021-12-23 LAB — PREGNANCY, URINE: Preg Test, Ur: NEGATIVE

## 2021-12-23 MED ORDER — IOHEXOL 350 MG/ML SOLN
80.0000 mL | Freq: Once | INTRAVENOUS | Status: AC | PRN
  Administered 2021-12-23: 80 mL via INTRAVENOUS

## 2021-12-23 NOTE — ED Notes (Signed)
Patient transported to CT 

## 2021-12-23 NOTE — ED Provider Notes (Signed)
Care assumed from Dr. Lynelle Doctor, patient with left calf swelling, chest pain and shortness of breath after a long plane flight. She is pending CT angiogram of the chest. If negative, will need to return for venous ultrasound to rule out DVT.  CT angiogram shows no evidence of pulmonary embolism, no pathology noted.  I have independently viewed the images, and agree with radiologist interpretation.  I have ordered a dose of rivaroxaban to treat for possible DVT and I have ordered an outpatient venous ultrasound of both legs to rule out DVT.  If negative, she is referred to sports medicine for further evaluation and treatment.  Results for orders placed or performed during the hospital encounter of 12/23/21  CBC  Result Value Ref Range   WBC 8.7 4.0 - 10.5 K/uL   RBC 4.54 3.87 - 5.11 MIL/uL   Hemoglobin 12.5 12.0 - 15.0 g/dL   HCT 72.5 36.6 - 44.0 %   MCV 79.7 (L) 80.0 - 100.0 fL   MCH 27.5 26.0 - 34.0 pg   MCHC 34.5 30.0 - 36.0 g/dL   RDW 34.7 42.5 - 95.6 %   Platelets 234 150 - 400 K/uL   nRBC 0.0 0.0 - 0.2 %  Basic metabolic panel  Result Value Ref Range   Sodium 137 135 - 145 mmol/L   Potassium 4.3 3.5 - 5.1 mmol/L   Chloride 110 98 - 111 mmol/L   CO2 22 22 - 32 mmol/L   Glucose, Bld 94 70 - 99 mg/dL   BUN 8 6 - 20 mg/dL   Creatinine, Ser 3.87 0.44 - 1.00 mg/dL   Calcium 8.5 (L) 8.9 - 10.3 mg/dL   GFR, Estimated >56 >43 mL/min   Anion gap 5 5 - 15  Pregnancy, urine  Result Value Ref Range   Preg Test, Ur NEGATIVE NEGATIVE  Troponin I (High Sensitivity)  Result Value Ref Range   Troponin I (High Sensitivity) <2 <18 ng/L   CT Angio Chest PE W and/or Wo Contrast  Result Date: 12/23/2021 CLINICAL DATA:  Concern for pulmonary embolism. EXAM: CT ANGIOGRAPHY CHEST WITH CONTRAST TECHNIQUE: Multidetector CT imaging of the chest was performed using the standard protocol during bolus administration of intravenous contrast. Multiplanar CT image reconstructions and MIPs were obtained to  evaluate the vascular anatomy. RADIATION DOSE REDUCTION: This exam was performed according to the departmental dose-optimization program which includes automated exposure control, adjustment of the mA and/or kV according to patient size and/or use of iterative reconstruction technique. CONTRAST:  90mL OMNIPAQUE IOHEXOL 350 MG/ML SOLN COMPARISON:  Chest CT dated 10/11/2021. FINDINGS: Evaluation of this exam is limited due to respiratory motion artifact. Cardiovascular: There is no cardiomegaly or pericardial effusion. The thoracic aorta is unremarkable. The origins of the great vessels of the aortic arch appear patent. No pulmonary artery embolus identified. Mediastinum/Nodes: No hilar or mediastinal adenopathy. The esophagus and thyroid gland are grossly unremarkable. No mediastinal fluid collection. Lungs/Pleura: The lungs are clear. There is no pleural effusion pneumothorax. The central airways are patent. Upper Abdomen: No acute abnormality. Musculoskeletal: Chronic appearing fracture of the sternal manubrium. No acute osseous pathology. Review of the MIP images confirms the above findings. IMPRESSION: No acute intrathoracic pathology. No CT evidence of pulmonary embolism. Electronically Signed   By: Elgie Collard M.D.   On: 12/23/2021 23:45       Dione Booze, MD 12/24/21 [phone]

## 2021-12-23 NOTE — ED Triage Notes (Signed)
Pt reports BIL leg pain, chest pain, SHOB, and swelling in L calf. Recent trip to and from [place].

## 2021-12-23 NOTE — Therapy (Signed)
[facility] [address] [place], [place], [address] Phone: [phone]   Fax:  [phone]  Patient Details  Name: Amanda French MRN: [id number] Date of Birth: [birth year] Referring Provider:  No ref. provider found  Encounter Date: 12/23/2021  [name] arrives today doing OK- still having concern about her calves feeling tight and painful after walking short distances.   She also told me today she has been having palpitations and severe shortness of breath with low level activities.   Quickly became visibly short of breath within 1-2 minutes of standing activities today with HR as high as 124BPM, also symptomatic with cardiac symptoms.   L calf was more swollen and warmer than her R calf, no redness/color changes noted.   She returned from [place] recently (about a 14 hour flight). Given her symptoms, I am extremely concerned that she may have developed a DVT on the flight, and that she may currently have a PE.   Educated and counseled her to immediately to go to the ED for w/u and treatment.   No charge for today's session.   Lerry Liner PT, DPT, PN2   Supplemental Physical Therapist [place]       [facility] [place] [facility] [address] [place], [place], [address] Phone: [phone]   Fax:  [phone]
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
## 2021-12-24 ENCOUNTER — Ambulatory Visit (HOSPITAL_BASED_OUTPATIENT_CLINIC_OR_DEPARTMENT_OTHER)
Admit: 2021-12-24 | Discharge: 2021-12-24 | Disposition: A | Discharge status: Home / Self Care | Payer: Self-pay | Attending: Emergency Medicine | Admitting: Emergency Medicine

## 2021-12-24 MED ORDER — RIVAROXABAN 15 MG PO TABS
15.0000 mg | ORAL_TABLET | Freq: Once | ORAL | Status: AC
  Administered 2021-12-24: 15 mg via ORAL
  Filled 2021-12-24: qty 1

## 2021-12-24 NOTE — Discharge Instructions (Signed)
Return in the morning for an ultrasound to make sure you don't have a blood clot in your leg. If the ultrasound does not show a clot, you can take ibuprofen or naproxen as needed for pain.

## 2021-12-24 NOTE — Progress Notes (Signed)
Venous doppler study refused  Patient refused Venous doppler study and requesting female sonographer. I have explained that I am the only one during late shift the risk of having DVT . I have also contacted the ed Provider on the shift tegeler christopher j MD and explain the situation. The provide stated that as long as we explained the risk of having blood clot and patient still refused the test , that will be her decision. Patient stated that she will maker an appointment to come and do it tomorrow on 12/25/2021. The interpreter named Daleen Bo  was present during the conversation .

## 2021-12-25 ENCOUNTER — Ambulatory Visit (HOSPITAL_BASED_OUTPATIENT_CLINIC_OR_DEPARTMENT_OTHER): Admission: RE | Admit: 2021-12-25 | Payer: Self-pay | Source: Ambulatory Visit

## 2021-12-26 ENCOUNTER — Ambulatory Visit (HOSPITAL_BASED_OUTPATIENT_CLINIC_OR_DEPARTMENT_OTHER)
Admission: RE | Admit: 2021-12-26 | Discharge: 2021-12-26 | Disposition: A | Discharge status: Home / Self Care | Payer: Self-pay | Source: Ambulatory Visit | Attending: Emergency Medicine | Admitting: Emergency Medicine

## 2021-12-26 ENCOUNTER — Ambulatory Visit: Payer: Self-pay | Admitting: Physical Therapy

## 2021-12-26 DIAGNOSIS — M79662 Pain in left lower leg: Secondary | ICD-10-CM | POA: Insufficient documentation

## 2021-12-30 ENCOUNTER — Ambulatory Visit: Payer: Self-pay

## 2021-12-30 DIAGNOSIS — R252 Cramp and spasm: Secondary | ICD-10-CM

## 2021-12-30 DIAGNOSIS — R29898 Other symptoms and signs involving the musculoskeletal system: Secondary | ICD-10-CM

## 2021-12-30 DIAGNOSIS — M542 Cervicalgia: Secondary | ICD-10-CM

## 2022-01-07 ENCOUNTER — Ambulatory Visit: Payer: Self-pay | Attending: Emergency Medicine | Admitting: Rehabilitative and Restorative Service Providers"

## 2022-01-07 DIAGNOSIS — R29898 Other symptoms and signs involving the musculoskeletal system: Secondary | ICD-10-CM | POA: Insufficient documentation

## 2022-01-07 DIAGNOSIS — R252 Cramp and spasm: Secondary | ICD-10-CM | POA: Insufficient documentation

## 2022-01-07 DIAGNOSIS — M542 Cervicalgia: Secondary | ICD-10-CM | POA: Insufficient documentation

## 2022-01-07 NOTE — Patient Instructions (Addendum)
Trigger Point Dry Needling  What is Trigger Point Dry Needling (DN)? DN is a physical therapy technique used to treat muscle pain and dysfunction. Specifically, DN helps deactivate muscle trigger points (muscle knots).  A thin filiform needle is used to penetrate the skin and stimulate the underlying trigger point. The goal is for a local twitch response (LTR) to occur and for the trigger point to relax. No medication of any kind is injected during the procedure.   What Does Trigger Point Dry Needling Feel Like?  The procedure feels different for each individual patient. Some patients report that they do not actually feel the needle enter the skin and overall the process is not painful. Very mild bleeding may occur. However, many patients feel a deep cramping in the muscle in which the needle was inserted. This is the local twitch response.   How Will I feel after the treatment? Soreness is normal, and the onset of soreness may not occur for a few hours. Typically this soreness does not last longer than two days.  Bruising is uncommon, however; ice can be used to decrease any possible bruising.  In rare cases feeling tired or nauseous after the treatment is normal. In addition, your symptoms may get worse before they get better, this period will typically not last longer than 24 hours.   What Can I do After My Treatment? Increase your hydration by drinking more water for the next 24 hours. You may place ice or heat on the areas treated that have become sore, however, do not use heat on inflamed or bruised areas. Heat often brings more relief post needling. You can continue your regular activities, but vigorous activity is not recommended initially after the treatment for 24 hours. DN is best combined with other physical therapy such as strengthening, stretching, and other therapies.   Access Code: [facility] URL: [email or web address] Date: 01/07/2022 Prepared by: Corlis Leak  Exercises - Seated Cervical Rotation AROM  - 2 x daily - 7 x weekly - 1 sets - 10 reps - 5 sec hold - Seated Cervical Sidebending AROM  - 2 x daily - 7 x weekly - 1 sets - 10 reps - 5 sec hold - Seated Cervical Flexion AROM  - 2 x daily - 7 x weekly - 1 sets - 10 reps - 5 sec hold - Seated Cervical Extension AROM  - 2 x daily - 7 x weekly - 1 sets - 10 reps - 5 sec hold - Seated Cervical Retraction  - 2 x daily - 7 x weekly - 1 sets - 10 reps - 5 sec hold - Doorway Pec Stretch at 60 Degrees Abduction with Arm Straight  - 2 x daily - 7 x weekly - 1 sets - 3 reps - 30 sec hold - Doorway Pec Stretch at 60 Elevation  - 2 x daily - 7 x weekly - 1 sets - 3 reps - 30 sec hold - Seated Carpal Tunnel Tissue Massage  - 1 x daily - 7 x weekly - 3 sets - 10 reps - Seated Elbow Flexion with Resistance  - 1 x daily - 7 x weekly - 3 sets - 10 reps - Seated Single Arm Shoulder Flexion  - 1 x daily - 7 x weekly - 3 sets - 10 reps - Seated Gripping Towel  - 1 x daily - 7 x weekly - 3 sets - 10 reps - Seated Upper Trapezius Stretch  - 1 x daily - 7 x  weekly - 3 sets - 3 reps - 30 hold - Seated Scalene Stretch with Towel  - 1 x daily - 7 x weekly - 3 sets - 3 reps - 30 hold - Seated Wrist Flexion Active Stretch Pronated with Elbow Straight  - 2 x daily - 7 x weekly - 1 sets - 3 reps - 20-30 sec  hold - Seated Wrist Extension Stretch  - 2 x daily - 7 x weekly - 1 sets - 3 reps - 20-30 sec  hold

## 2022-01-07 NOTE — Therapy (Signed)
[facility] [place] [address] [place], [place], [address] Phone: [phone]   Fax:  [phone]  Physical Therapy Treatment  Rationale for Evaluation and Treatment Rehabilitation  Patient Details  Name: Amanda French MRN: [id number] Date of Birth: Aug 16, 1980 Referring Provider (PT): Susy Frizzle, MD   Encounter Date: 01/07/2022   PT End of Session - 01/07/22 0945     Visit Number 10    Date for PT Re-Evaluation 01/12/22    PT Start Time 0945   pt 15 min late for appt   PT Stop Time 1015   MH end of treatment   PT Time Calculation (min) 30 min    Activity Tolerance Patient tolerated treatment well             Past Medical History:  Diagnosis Date   DDD (degenerative disc disease), lumbar     Past Surgical History:  Procedure Laterality Date   TONSILLECTOMY      There were no vitals filed for this visit.   Subjective Assessment - 01/07/22 0945     Subjective Patient reports pain in both arms Rt > Lt. Increased pain with use. Working on exercises at home.    Currently in Pain? Yes    Pain Score 4     Pain Location Arm    Pain Orientation Right;Left    Pain Descriptors / Indicators Discomfort    Pain Type Acute pain    Pain Onset More than a month ago    Pain Frequency Intermittent                               [facility] Adult PT Treatment/Exercise - 01/07/22 0001       Hand Exercises for Cervical Radiculopathy   Other Hand Exercise for Cervical Radiculopathy extensor forearm stretch 20 sec x 2 reps bilat; flexor frearm stretch 20 sec x 2 reps each side      Moist Heat Therapy   Number Minutes Moist Heat 10 Minutes    Moist Heat Location Elbow   bilat     Manual Therapy   Manual Therapy Soft tissue mobilization    Manual therapy comments skilled palpation to assess response to Dn and manual work    Soft tissue mobilization deep tissue work Rt?Lt extensor and flexor forearm               Trigger Point Dry Needling - 01/07/22 0001     Consent Given? Yes    Education Handout Provided Yes    Flexor carpi ulnaris Response Palpable increased muscle length    Pronator teres Response Palpable increased muscle length    Extensor carpi radialis longus/brevis Response Palpable increased muscle length    Extensor digitorum Response Palpable increased muscle length                   PT Education - 01/07/22 1015     Education Details HEP DN    Person(s) Educated Patient    Methods Explanation;Demonstration;Tactile cues;Verbal cues;Handout    Comprehension Verbalized understanding;Returned demonstration;Verbal cues required;Tactile cues required              PT Short Term Goals - 10/28/21 1446       PT SHORT TERM GOAL #1   Title Ind with initial HEP    Time 2    Period Weeks    Status New      PT SHORT  TERM GOAL #2   Title Complete neck assessment    Period Weeks    Status New    Target Date 10/20/21               PT Long Term Goals - 10/28/21 1446       PT LONG TERM GOAL #1   Title ind with HEP and progression    Time 8    Period Weeks    Status New    Target Date 12/08/21      PT LONG TERM GOAL #2   Title decreased neck and chest pain by >= 90% with ADLs including lifting and carrying.    Time 8    Period Weeks    Status New      PT LONG TERM GOAL #3   Title improved neck ROM to [facility] to peform ADLS    Time 8    Period Weeks    Status New      PT LONG TERM GOAL #4   Title Patient able to sleep without feeling N/T in bil UE    Time 8    Period Weeks    Status New      PT LONG TERM GOAL #5   Title BUE strength to improve to 5/5 and she will be able to complete functional cooking and home keeping tasks with pain no more than 4/10    Time 8    Period Weeks    Status New                   Plan - 01/07/22 1010     Clinical Impression Statement Patient reports continued pain in the Rt > Lt forearm which  is increased with functional activities. She has palpable tightness in the extensor and flexor forearms Rt > Lt. Tolerated DN fairly well with c/o pain during and after. Discussed that she may have some soreness but should not have increased pain. She was encouraged to drink more water today and continue to use UE's for exercises and functional activities    Rehab Potential Excellent    PT Frequency 2x / week    PT Duration 4 weeks    PT Treatment/Interventions ADLs/Self Care Home Management;Electrical Stimulation;Cryotherapy;Moist Heat;Traction;Neuromuscular re-education;Patient/family education;Therapeutic exercise;Therapeutic activities;Manual techniques;Dry needling;Passive range of motion;Taping    PT Next Visit Plan assess response to dry needling, forearm and UE strengthening    PT Home Exercise Plan [facility]    Consulted and Agree with Plan of Care Patient             Patient will benefit from skilled therapeutic intervention in order to improve the following deficits and impairments:     Visit Diagnosis: Other symptoms and signs involving the musculoskeletal system  Cramp and spasm  Cervicalgia     Problem List There are no problems to display for this patient.   Val Riles, PT, MPH  01/07/2022, 10:22 AM  [facility] [address] [place], [place], [address] Phone: [phone]   Fax:  [phone]  Name: Amanda French MRN: [id number] Date of Birth: April 28, 1981

## 2022-01-08 ENCOUNTER — Ambulatory Visit: Payer: Self-pay

## 2022-01-08 DIAGNOSIS — R29898 Other symptoms and signs involving the musculoskeletal system: Secondary | ICD-10-CM

## 2022-01-08 DIAGNOSIS — R252 Cramp and spasm: Secondary | ICD-10-CM

## 2022-01-08 NOTE — Therapy (Signed)
[facility] [place] [address] [place], [place], [address] Phone: [phone]   Fax:  [phone]  Physical Therapy Treatment  Patient Details  Name: Amanda French MRN: [id number] Date of Birth: 10/31/1980 Referring Provider (PT): Susy Frizzle, MD   Encounter Date: 01/08/2022   PT End of Session - 01/08/22 1724     Visit Number 11    Date for PT Re-Evaluation 01/12/22    PT Start Time 1723    PT Stop Time 1803    PT Time Calculation (min) 40 min    Activity Tolerance Patient tolerated treatment well    Behavior During Therapy [facility] for tasks assessed/performed             Past Medical History:  Diagnosis Date   DDD (degenerative disc disease), lumbar     Past Surgical History:  Procedure Laterality Date   TONSILLECTOMY      There were no vitals filed for this visit.      Treatment Extensor forearm stretch 20sesc x3 Flexor forearm stretch 20secs x3 STM/deep tissue work to R extensors and flexors  Wrist extension with 1# rod 2x10 Bicep curls 2# 2x10 Towel twists 2x10    Pronation/supination 2# 20 reps  Hand griper 2x10                            PT Short Term Goals - 10/28/21 1446       PT SHORT TERM GOAL #1   Title Ind with initial HEP    Time 2    Period Weeks    Status New      PT SHORT TERM GOAL #2   Title Complete neck assessment    Period Weeks    Status New    Target Date 10/20/21               PT Long Term Goals - 10/28/21 1446       PT LONG TERM GOAL #1   Title ind with HEP and progression    Time 8    Period Weeks    Status New    Target Date 12/08/21      PT LONG TERM GOAL #2   Title decreased neck and chest pain by >= 90% with ADLs including lifting and carrying.    Time 8    Period Weeks    Status New      PT LONG TERM GOAL #3   Title improved neck ROM to [facility] to peform ADLS    Time 8    Period Weeks    Status New      PT LONG TERM GOAL #4    Title Patient able to sleep without feeling N/T in bil UE    Time 8    Period Weeks    Status New      PT LONG TERM GOAL #5   Title BUE strength to improve to 5/5 and she will be able to complete functional cooking and home keeping tasks with pain no more than 4/10    Time 8    Period Weeks    Status New                   Plan - 01/08/22 1806     Clinical Impression Statement Patient responded well to DN and reports it helped a lot with her pain. She is able to do exercises today that she was  unable to do last visit with 2# weights. I did more STM to her R forearm extensors and flexors and she had noticeable decreased tightness from last week. She is  very pleased with how she is doing after the dry needling. She would like to be referred to [facility] again to get dry needling again because of how much relief it provided her. I will reach out again to see what they can do for her.    Examination-Activity Limitations Carry;Lift    Rehab Potential Excellent    PT Frequency 2x / week    PT Duration 4 weeks    PT Treatment/Interventions ADLs/Self Care Home Management;Electrical Stimulation;Cryotherapy;Moist Heat;Traction;Neuromuscular re-education;Patient/family education;Therapeutic exercise;Therapeutic activities;Manual techniques;Dry needling;Passive range of motion;Taping    PT Next Visit Plan referral over to [facility] for dry needling again, forearm and UE strengthening, grip strengthening    PT Home Exercise Plan [facility]    Consulted and Agree with Plan of Care Patient             Patient will benefit from skilled therapeutic intervention in order to improve the following deficits and impairments:  Pain, Impaired UE functional use, Decreased strength, Decreased range of motion, Impaired flexibility, Decreased activity tolerance, Increased muscle spasms  Visit Diagnosis: No diagnosis found.     Problem List There are no problems to display for this  patient.   Cassie Freer, PT 01/08/2022, 6:07 PM  [facility] [place] [address] [place], [place], [address] Phone: [phone]   Fax:  [phone]  Name: Amanda French MRN: [id number] Date of Birth: 1980-08-19

## 2022-01-12 ENCOUNTER — Ambulatory Visit: Payer: Self-pay

## 2022-01-12 DIAGNOSIS — R252 Cramp and spasm: Secondary | ICD-10-CM

## 2022-01-12 DIAGNOSIS — M542 Cervicalgia: Secondary | ICD-10-CM

## 2022-01-12 DIAGNOSIS — R29898 Other symptoms and signs involving the musculoskeletal system: Secondary | ICD-10-CM

## 2022-01-12 NOTE — Therapy (Signed)
[facility] [place] [address] [place], [place], [address] Phone: [phone]   Fax:  [phone]  Physical Therapy Treatment  Patient Details  Name: Carlena Ruybal MRN: [id number] Date of Birth: 05/20/1981 Referring Provider (PT): Susy Frizzle, MD   Encounter Date: 01/12/2022   PT End of Session - 01/12/22 1711     Visit Number 12    Date for PT Re-Evaluation 01/12/22    PT Start Time 1711    PT Stop Time 1750    PT Time Calculation (min) 39 min    Activity Tolerance Patient tolerated treatment well    Behavior During Therapy [facility] for tasks assessed/performed              Past Medical History:  Diagnosis Date   DDD (degenerative disc disease), lumbar     Past Surgical History:  Procedure Laterality Date   TONSILLECTOMY      There were no vitals filed for this visit.   Subjective Assessment - 01/12/22 1741     Subjective Patient did a lot of baking so is having pain in her biceps. The pain in her forearms is better than before but now it is radiating in to her biceps but it feels like weakness. Took pain medication to help with the pain.    Patient is accompained by: Interpreter    Pertinent History L4/5 disc    Diagnostic tests XR: Lower cervical spondylosis and facet hypertrophy; Chest CT negative, Rt shoulder xray negative    Patient Stated Goals to get rid of pain    Currently in Pain? Yes    Pain Location Arm    Pain Orientation Right    Pain Onset More than a month ago    Pain Onset 1 to 4 weeks ago              Treatment UBE L2 x6  Wrist extension with 2# rod 2x10  Bicep curls 3# 2x10  Pronation/supination with green ball 10reps   Ulnar nerve gliding                          PT Short Term Goals - 10/28/21 1446       PT SHORT TERM GOAL #1   Title Ind with initial HEP    Time 2    Period Weeks    Status New      PT SHORT TERM GOAL #2   Title Complete neck  assessment    Period Weeks    Status New    Target Date 10/20/21               PT Long Term Goals - 10/28/21 1446       PT LONG TERM GOAL #1   Title ind with HEP and progression    Time 8    Period Weeks    Status New    Target Date 12/08/21      PT LONG TERM GOAL #2   Title decreased neck and chest pain by >= 90% with ADLs including lifting and carrying.    Time 8    Period Weeks    Status New      PT LONG TERM GOAL #3   Title improved neck ROM to [facility] to peform ADLS    Time 8    Period Weeks    Status New      PT LONG TERM GOAL #4   Title Patient  able to sleep without feeling N/T in bil UE    Time 8    Period Weeks    Status New      PT LONG TERM GOAL #5   Title BUE strength to improve to 5/5 and she will be able to complete functional cooking and home keeping tasks with pain no more than 4/10    Time 8    Period Weeks    Status New                   Plan - 01/12/22 1800     Clinical Impression Statement We did some testing today to see if we could find the root of her pain. She has some N/T in her hands especially in the mornings and afterwards it is only in her third and fourth finger. We went over ulnar nerve glides. Carpal tunnel testing with phalens and reverse phalen's no N/T but is painful because it stretches in her hands. Lateral epicondylitis testing, pain with first position but feels like a stretching is the rest of the movements. The pain in her biceps is constant but when she goes to lift and pick up things the pain is in her hands and forearm. She is able to tolerate exercises today with mild pain. We talked about possible OT referral to help with grip strength and fine motor skills. She agreed to trying it and will do anything as long as it helps.    Examination-Activity Limitations Carry;Lift    Rehab Potential Excellent    PT Frequency 2x / week    PT Duration 4 weeks    PT Treatment/Interventions ADLs/Self Care Home  Management;Electrical Stimulation;Cryotherapy;Moist Heat;Traction;Neuromuscular re-education;Patient/family education;Therapeutic exercise;Therapeutic activities;Manual techniques;Dry needling;Passive range of motion;Taping    PT Next Visit Plan reach out for OT or hand specialist referral    PT Home Exercise Plan [facility]    Consulted and Agree with Plan of Care Patient              Patient will benefit from skilled therapeutic intervention in order to improve the following deficits and impairments:  Pain, Impaired UE functional use, Decreased strength, Decreased range of motion, Impaired flexibility, Decreased activity tolerance, Increased muscle spasms  Visit Diagnosis: Other symptoms and signs involving the musculoskeletal system  Cramp and spasm  Cervicalgia     Problem List There are no problems to display for this patient.   Cassie Freer, [place] 01/12/2022, 6:02 PM  [facility] [place] [facility] [address] [place], [place], [address] Phone: [phone]   Fax:  [phone]  Name: Delmy Holdren MRN: [id number] Date of Birth: 03/02/1981

## 2022-01-22 ENCOUNTER — Encounter: Payer: Self-pay | Admitting: Rehabilitative and Restorative Service Providers"

## 2022-01-22 ENCOUNTER — Ambulatory Visit: Payer: Self-pay | Admitting: Rehabilitative and Restorative Service Providers"

## 2022-01-22 DIAGNOSIS — R252 Cramp and spasm: Secondary | ICD-10-CM

## 2022-01-22 DIAGNOSIS — R29898 Other symptoms and signs involving the musculoskeletal system: Secondary | ICD-10-CM

## 2022-01-22 DIAGNOSIS — M542 Cervicalgia: Secondary | ICD-10-CM

## 2022-01-22 NOTE — Therapy (Signed)
[facility] [place] [address] [place], [place], [address] Phone: [phone]   Fax:  [phone]  Physical Therapy Treatment Rationale for Evaluation and Treatment Rehabilitation  Patient Details  Name: Amanda French MRN: [id number] Date of Birth: [birth year] Referring Provider (PT): Susy Frizzle, MD   Encounter Date: 01/22/2022   PT End of Session - 01/22/22 1457     Visit Number 13    PT Start Time 1455    PT Stop Time 1530    PT Time Calculation (min) 35 min    Activity Tolerance Patient tolerated treatment well             Past Medical History:  Diagnosis Date   DDD (degenerative disc disease), lumbar     Past Surgical History:  Procedure Laterality Date   TONSILLECTOMY      There were no vitals filed for this visit.   Subjective Assessment - 01/22/22 1501     Subjective Patient reports that her arms feel better following dry needling at last sessioin.    Currently in Pain? Yes    Pain Score 4     Pain Location Arm    Pain Orientation Right;Left    Pain Descriptors / Indicators Discomfort                               [facility] Adult PT Treatment/Exercise - 01/22/22 0001       Hand Exercises for Cervical Radiculopathy   Other Hand Exercise for Cervical Radiculopathy extensor forearm stretch 20 sec x 2 reps bilat; flexor frearm stretch 20 sec x 2 reps each side      Moist Heat Therapy   Number Minutes Moist Heat 10 Minutes    Moist Heat Location Elbow   bilat     Manual Therapy   Manual Therapy Soft tissue mobilization    Manual therapy comments skilled palpation to assess response to Dn and manual work    Soft tissue mobilization deep tissue work Rt?Lt extensor and flexor forearm              Trigger Point Dry Needling - 01/22/22 0001     Consent Given? Yes    Education Handout Provided Previously provided    Other Dry Needling bilat    Biceps Response Palpable  increased muscle length   Rt only   Flexor carpi ulnaris Response Palpable increased muscle length    Pronator teres Response Palpable increased muscle length    Extensor carpi radialis longus/brevis Response Palpable increased muscle length    Extensor digitorum Response Palpable increased muscle length                     PT Short Term Goals - 10/28/21 1446       PT SHORT TERM GOAL #1   Title Ind with initial HEP    Time 2    Period Weeks    Status New      PT SHORT TERM GOAL #2   Title Complete neck assessment    Period Weeks    Status New    Target Date 10/20/21               PT Long Term Goals - 01/22/22 1458       PT LONG TERM GOAL #1   Title ind with HEP and progression    Time 8    Status New  Target Date 12/08/21      PT LONG TERM GOAL #2   Title decreased neck and chest pain by >= 90% with ADLs including lifting and carrying.    Time 8    Period Weeks    Status New      PT LONG TERM GOAL #3   Title improved neck ROM to [facility] to peform ADLS    Time 8    Period Weeks    Status New      PT LONG TERM GOAL #4   Title Patient able to sleep without feeling N/T in bil UE    Time 8    Period Weeks    Status New      PT LONG TERM GOAL #5   Title BUE strength to improve to 5/5 and she will be able to complete functional cooking and home keeping tasks with pain no more than 4/10    Time 8    Period Weeks    Status New                   Plan - 01/22/22 1518     Clinical Impression Statement Continued treatment consistig of DN and manual work through bilat forearms and Rt biceps area. Encouraged patient to discuss questions about numbness andtingling in her hands with primary therapist.    Rehab Potential Excellent    PT Frequency 2x / week    PT Duration 4 weeks    PT Treatment/Interventions ADLs/Self Care Home Management;Electrical Stimulation;Cryotherapy;Moist Heat;Traction;Neuromuscular re-education;Patient/family  education;Therapeutic exercise;Therapeutic activities;Manual techniques;Dry needling;Passive range of motion;Taping    PT Home Exercise Plan [facility]    Consulted and Agree with Plan of Care Patient             Patient will benefit from skilled therapeutic intervention in order to improve the following deficits and impairments:     Visit Diagnosis: Other symptoms and signs involving the musculoskeletal system  Cramp and spasm  Cervicalgia     Problem List There are no problems to display for this patient.   Val Riles, PT, MPH  01/22/2022, 3:20 PM  [facility] [address] [place], [place], [address] Phone: [phone]   Fax:  [phone]  Name: Amanda French MRN: [id number] Date of Birth: 10-11-1980

## 2022-01-27 NOTE — Therapy (Signed)
[facility] [place] [address] [place], [place], [address] Phone: [phone]   Fax:  [phone]  Physical Therapy Treatment  Patient Details  Name: Amanda French MRN: [id number] Date of Birth: [birth year] Referring Provider (PT): Susy Frizzle, MD   Encounter Date: 01/28/2022      Past Medical History:  Diagnosis Date   DDD (degenerative disc disease), lumbar     Past Surgical History:  Procedure Laterality Date   TONSILLECTOMY      There were no vitals filed for this visit.      Treatment UBE L2 x6  Shoulder ext  Bicep curls   Tricep ext   STM   Wrist extension with 2#   Bicep curls 3# 2x10  Ulnar nerve gliding                            PT Short Term Goals - 10/28/21 1446       PT SHORT TERM GOAL #1   Title Ind with initial HEP    Time 2    Period Weeks    Status New      PT SHORT TERM GOAL #2   Title Complete neck assessment    Period Weeks    Status New    Target Date 10/20/21               PT Long Term Goals - 01/22/22 1458       PT LONG TERM GOAL #1   Title ind with HEP and progression    Time 8    Status New    Target Date 12/08/21      PT LONG TERM GOAL #2   Title decreased neck and chest pain by >= 90% with ADLs including lifting and carrying.    Time 8    Period Weeks    Status New      PT LONG TERM GOAL #3   Title improved neck ROM to [facility] to peform ADLS    Time 8    Period Weeks    Status New      PT LONG TERM GOAL #4   Title Patient able to sleep without feeling N/T in bil UE    Time 8    Period Weeks    Status New      PT LONG TERM GOAL #5   Title BUE strength to improve to 5/5 and she will be able to complete functional cooking and home keeping tasks with pain no more than 4/10    Time 8    Period Weeks    Status New                      Patient will benefit from skilled therapeutic intervention in order to  improve the following deficits and impairments:     Visit Diagnosis: No diagnosis found.     Problem List There are no problems to display for this patient.   Amanda French, Amanda French 01/27/2022, 9:53 AM  [facility] [place] [address] [place], [place], [address] Phone: [phone]   Fax:  [phone]  Name: Amanda French MRN: [id number] Date of Birth: [birth year]

## 2022-01-28 ENCOUNTER — Ambulatory Visit: Payer: Self-pay

## 2022-01-28 DIAGNOSIS — R29898 Other symptoms and signs involving the musculoskeletal system: Secondary | ICD-10-CM

## 2022-01-28 DIAGNOSIS — R252 Cramp and spasm: Secondary | ICD-10-CM

## 2022-01-28 DIAGNOSIS — M542 Cervicalgia: Secondary | ICD-10-CM

## 2022-02-02 ENCOUNTER — Ambulatory Visit: Payer: Self-pay

## 2022-02-02 DIAGNOSIS — R252 Cramp and spasm: Secondary | ICD-10-CM

## 2022-02-02 DIAGNOSIS — R29898 Other symptoms and signs involving the musculoskeletal system: Secondary | ICD-10-CM

## 2022-02-02 DIAGNOSIS — M542 Cervicalgia: Secondary | ICD-10-CM

## 2022-02-02 NOTE — Therapy (Signed)
[facility] [place] [address] [place], [place], [address] Phone: [phone]   Fax:  [phone]  Physical Therapy Treatment  Patient Details  Name: Amanda French MRN: [id number] Date of Birth: March 31, 1981 Referring Provider (PT): Susy Frizzle, MD   Encounter Date: 02/02/2022   PT End of Session - 02/02/22 1546     Visit Number 15    PT Start Time 1546    PT Stop Time 1615    PT Time Calculation (min) 29 min    Activity Tolerance Patient tolerated treatment well    Behavior During Therapy [facility] for tasks assessed/performed                Past Medical History:  Diagnosis Date   DDD (degenerative disc disease), lumbar     Past Surgical History:  Procedure Laterality Date   TONSILLECTOMY      There were no vitals filed for this visit.   Subjective Assessment - 02/02/22 1549     Subjective Doing good, better than before. Had some numbness in the morning, worked really hard in her yard.                Treatment UBE L3 x4 Seated rows 20# 2x10 Lat pull downs 15#x10, 20# x10   Farmer's carry 20# 2 laps bilaterally  Shoulder ext 10# 2x10 Rows 5# 2x10 Bicep curls 15# 2x10 Tricep ext 20# 2x10                          PT Short Term Goals - 10/28/21 1446       PT SHORT TERM GOAL #1   Title Ind with initial HEP    Time 2    Period Weeks    Status New      PT SHORT TERM GOAL #2   Title Complete neck assessment    Period Weeks    Status New    Target Date 10/20/21               PT Long Term Goals - 01/22/22 1458       PT LONG TERM GOAL #1   Title ind with HEP and progression    Time 8    Status New    Target Date 12/08/21      PT LONG TERM GOAL #2   Title decreased neck and chest pain by >= 90% with ADLs including lifting and carrying.    Time 8    Period Weeks    Status New      PT LONG TERM GOAL #3   Title improved neck ROM to [facility] to peform ADLS    Time 8     Period Weeks    Status New      PT LONG TERM GOAL #4   Title Patient able to sleep without feeling N/T in bil UE    Time 8    Period Weeks    Status New      PT LONG TERM GOAL #5   Title BUE strength to improve to 5/5 and she will be able to complete functional cooking and home keeping tasks with pain no more than 4/10    Time 8    Period Weeks    Status New                   Plan - 02/02/22 1616     Clinical Impression Statement Patient returns with  decreased pain levels, and seems to be improving overall. We focused on shoulder/UE and neck strengthening. She has the most difficulty with farmer's carry and states it caused some numbness in her L arm. Continue with strengthening for bilateral UE to help ease ADLs and increase grip strength.    Rehab Potential Excellent    PT Frequency 2x / week    PT Duration 4 weeks    PT Treatment/Interventions ADLs/Self Care Home Management;Electrical Stimulation;Cryotherapy;Moist Heat;Traction;Neuromuscular re-education;Patient/family education;Therapeutic exercise;Therapeutic activities;Manual techniques;Dry needling;Passive range of motion;Taping    PT Home Exercise Plan [facility]    Consulted and Agree with Plan of Care Patient                Patient will benefit from skilled therapeutic intervention in order to improve the following deficits and impairments:     Visit Diagnosis: Other symptoms and signs involving the musculoskeletal system  Cervicalgia  Cramp and spasm     Problem List There are no problems to display for this patient.   [address]  Reid Hope King, [place] 02/02/2022, 4:17 PM  [facility] [place] [address] [place], [place], [address] Phone: [phone]   Fax:  [phone]  Name: Erine Phenix MRN: [id number] Date of Birth: Nov 29, 1980

## 2022-02-05 ENCOUNTER — Telehealth: Payer: Self-pay | Admitting: Family Medicine

## 2022-02-05 DIAGNOSIS — M25531 Pain in right wrist: Secondary | ICD-10-CM

## 2022-02-05 DIAGNOSIS — R29898 Other symptoms and signs involving the musculoskeletal system: Secondary | ICD-10-CM

## 2022-02-05 NOTE — Telephone Encounter (Signed)
OT referral placed.

## 2022-02-09 ENCOUNTER — Ambulatory Visit: Payer: Self-pay | Attending: Emergency Medicine

## 2022-02-09 DIAGNOSIS — R252 Cramp and spasm: Secondary | ICD-10-CM | POA: Insufficient documentation

## 2022-02-09 DIAGNOSIS — R29898 Other symptoms and signs involving the musculoskeletal system: Secondary | ICD-10-CM | POA: Insufficient documentation

## 2022-02-09 DIAGNOSIS — M542 Cervicalgia: Secondary | ICD-10-CM | POA: Insufficient documentation

## 2022-02-09 NOTE — Therapy (Signed)
[facility] [place] [address] [place], [place], [address] Phone: [phone]   Fax:  [phone]  Physical Therapy Treatment  Patient Details  Name: Amanda French MRN: [id number] Date of Birth: 04/28/1981 Referring Provider (PT): Susy Frizzle, MD   Encounter Date: 02/09/2022   PT End of Session - 02/09/22 1530     Visit Number 16    PT Start Time 1530    PT Stop Time 1613    PT Time Calculation (min) 43 min    Activity Tolerance Patient tolerated treatment well    Behavior During Therapy [facility] for tasks assessed/performed                 Past Medical History:  Diagnosis Date   DDD (degenerative disc disease), lumbar     Past Surgical History:  Procedure Laterality Date   TONSILLECTOMY      There were no vitals filed for this visit.   Subjective Assessment - 02/09/22 1537     Subjective I am doing better, used my hands to cook this weekend and it hurt at night and I had to take pain meds.    Patient is accompained by: Interpreter    Pertinent History L4/5 disc    Diagnostic tests XR: Lower cervical spondylosis and facet hypertrophy; Chest CT negative, Rt shoulder xray negative    Patient Stated Goals to get rid of pain    Pain Onset More than a month ago    Pain Onset 1 to 4 weeks ago                 Treatment UBE L3 x6 Supnation with greenTB 2x10  Bicep curls supinated 3# 2x10  Tricep ext 10# 2x10 RUE only  Face pull to OHP 5# 2x10 Wrist flexion/ext w/elbow bent 2# 2x10                          PT Short Term Goals - 10/28/21 1446       PT SHORT TERM GOAL #1   Title Ind with initial HEP    Time 2    Period Weeks    Status New      PT SHORT TERM GOAL #2   Title Complete neck assessment    Period Weeks    Status New    Target Date 10/20/21               PT Long Term Goals - 01/22/22 1458       PT LONG TERM GOAL #1   Title ind with HEP and progression     Time 8    Status New    Target Date 12/08/21      PT LONG TERM GOAL #2   Title decreased neck and chest pain by >= 90% with ADLs including lifting and carrying.    Time 8    Period Weeks    Status New      PT LONG TERM GOAL #3   Title improved neck ROM to [facility] to peform ADLS    Time 8    Period Weeks    Status New      PT LONG TERM GOAL #4   Title Patient able to sleep without feeling N/T in bil UE    Time 8    Period Weeks    Status New      PT LONG TERM GOAL #5   Title BUE strength to  improve to 5/5 and she will be able to complete functional cooking and home keeping tasks with pain no more than 4/10    Time 8    Period Weeks    Status New                   Plan - 02/09/22 1610     Clinical Impression Statement Patient is frustrated why she has not gotten answers about her hand and nerve pain. We did testing for cubital tunnel testing and it was positive, with symptoms onset early into the test. She was educated about her upcoming OT eval and the possibility of needing a brace, depending on the findings. Continued working on strengthening for her biceps and forearms.    Rehab Potential Excellent    PT Frequency 2x / week    PT Duration 4 weeks    PT Treatment/Interventions ADLs/Self Care Home Management;Electrical Stimulation;Cryotherapy;Moist Heat;Traction;Neuromuscular re-education;Patient/family education;Therapeutic exercise;Therapeutic activities;Manual techniques;Dry needling;Passive range of motion;Taping    PT Home Exercise Plan [facility]    Consulted and Agree with Plan of Care Patient                 Patient will benefit from skilled therapeutic intervention in order to improve the following deficits and impairments:     Visit Diagnosis: Other symptoms and signs involving the musculoskeletal system  Cramp and spasm  Cervicalgia     Problem List There are no problems to display for this patient.   Cassie Freer, [place] 02/09/2022, 4:13  PM  [facility] [place] [address] [place], [place], [address] Phone: [phone]   Fax:  [phone]  Name: Amanda French MRN: [id number] Date of Birth: January 28, 1981

## 2022-02-11 ENCOUNTER — Ambulatory Visit: Payer: Self-pay

## 2022-02-16 ENCOUNTER — Ambulatory Visit: Payer: Self-pay

## 2022-02-17 ENCOUNTER — Ambulatory Visit: Payer: Self-pay | Admitting: Occupational Therapy

## 2022-02-18 ENCOUNTER — Ambulatory Visit: Payer: Self-pay

## 2022-03-05 ENCOUNTER — Ambulatory Visit: Payer: Self-pay | Admitting: Physical Therapy

## 2022-03-05 ENCOUNTER — Ambulatory Visit: Payer: Self-pay | Admitting: Occupational Therapy

## 2022-03-05 NOTE — Therapy (Deleted)
[facility] [place] [address] [place], [place], [address] Phone: [phone]   Fax:  [phone]  Physical Therapy Treatment  Patient Details  Name: Amanda French MRN: [id number] Date of Birth: 1980-12-27 Referring Provider (PT): Susy Frizzle, MD   Encounter Date: 03/05/2022   PT End of Session - 03/05/22 1559     Visit Number 17    PT Start Time 1600                 Past Medical History:  Diagnosis Date   DDD (degenerative disc disease), lumbar     Past Surgical History:  Procedure Laterality Date   TONSILLECTOMY      There were no vitals filed for this visit.         Treatment UBE L3 x6 Supnation with greenTB 2x10  Bicep curls supinated 3# 2x10  Tricep ext 10# 2x10 RUE only  Face pull to OHP 5# 2x10 Wrist flexion/ext w/elbow bent 2# 2x10                          PT Short Term Goals - 10/28/21 1446       PT SHORT TERM GOAL #1   Title Ind with initial HEP    Time 2    Period Weeks    Status New      PT SHORT TERM GOAL #2   Title Complete neck assessment    Period Weeks    Status New    Target Date 10/20/21               PT Long Term Goals - 01/22/22 1458       PT LONG TERM GOAL #1   Title ind with HEP and progression    Time 8    Status New    Target Date 12/08/21      PT LONG TERM GOAL #2   Title decreased neck and chest pain by >= 90% with ADLs including lifting and carrying.    Time 8    Period Weeks    Status New      PT LONG TERM GOAL #3   Title improved neck ROM to [facility] to peform ADLS    Time 8    Period Weeks    Status New      PT LONG TERM GOAL #4   Title Patient able to sleep without feeling N/T in bil UE    Time 8    Period Weeks    Status New      PT LONG TERM GOAL #5   Title BUE strength to improve to 5/5 and she will be able to complete functional cooking and home keeping tasks with pain no more than 4/10    Time 8    Period  Weeks    Status New                         Patient will benefit from skilled therapeutic intervention in order to improve the following deficits and impairments:     Visit Diagnosis: Other symptoms and signs involving the musculoskeletal system     Problem List There are no problems to display for this patient.   Phelan Schadt,ANGIE, PTA 03/05/2022, 4:00 PM  [facility] [place] [address] [place], [place], [address] Phone: [phone]   Fax:  [phone]  Name: Amanda French MRN: [id number] Date  of Birth: Aug 18, 1982[facility] [place] [facility] [address] [place], [place], [address] Phone: [phone]   Fax:  [phone]  Patient Details  Name: Amanda French MRN: [id number] Date of Birth: [birth year] Referring Provider:  Pollyann Savoy, MD  Encounter Date: 03/05/2022   Suanne Marker, PTA 03/05/2022, 3:59 PM  [facility] [place] [address] [place], [place], [address] Phone: [phone]   Fax:  [phone]

## 2022-03-06 ENCOUNTER — Encounter: Payer: Self-pay | Admitting: Occupational Therapy

## 2022-03-06 ENCOUNTER — Ambulatory Visit: Payer: Self-pay | Attending: Family Medicine | Admitting: Occupational Therapy

## 2022-03-06 DIAGNOSIS — M79642 Pain in left hand: Secondary | ICD-10-CM | POA: Insufficient documentation

## 2022-03-06 DIAGNOSIS — R29898 Other symptoms and signs involving the musculoskeletal system: Secondary | ICD-10-CM | POA: Insufficient documentation

## 2022-03-06 DIAGNOSIS — M79641 Pain in right hand: Secondary | ICD-10-CM | POA: Insufficient documentation

## 2022-03-06 DIAGNOSIS — M542 Cervicalgia: Secondary | ICD-10-CM | POA: Insufficient documentation

## 2022-03-06 DIAGNOSIS — M6281 Muscle weakness (generalized): Secondary | ICD-10-CM | POA: Insufficient documentation

## 2022-03-06 DIAGNOSIS — R252 Cramp and spasm: Secondary | ICD-10-CM | POA: Insufficient documentation
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
## 2022-03-10 ENCOUNTER — Ambulatory Visit: Payer: Self-pay

## 2022-03-11 ENCOUNTER — Ambulatory Visit: Payer: Self-pay | Admitting: Physical Therapy

## 2022-03-11 ENCOUNTER — Encounter: Payer: Self-pay | Admitting: Physical Therapy

## 2022-03-11 DIAGNOSIS — M542 Cervicalgia: Secondary | ICD-10-CM

## 2022-03-11 DIAGNOSIS — M6281 Muscle weakness (generalized): Secondary | ICD-10-CM

## 2022-03-11 DIAGNOSIS — R29898 Other symptoms and signs involving the musculoskeletal system: Secondary | ICD-10-CM

## 2022-03-11 DIAGNOSIS — M79641 Pain in right hand: Secondary | ICD-10-CM

## 2022-03-11 DIAGNOSIS — M79642 Pain in left hand: Secondary | ICD-10-CM

## 2022-03-11 DIAGNOSIS — R252 Cramp and spasm: Secondary | ICD-10-CM

## 2022-03-11 NOTE — Therapy (Signed)
[facility] [place] [facility] [address] [place], [place], [address] Phone: [phone]   Fax:  [phone]  Physical Therapy Treatment    Patient Details  Name: Amanda French MRN: [id number] Date of Birth: [birth year] Referring Provider (PT): Susy Frizzle, MD   Encounter Date: 03/11/2022   PT End of Session - 03/11/22 1000     Visit Number 17    Date for PT Re-Evaluation 04/08/22    PT Start Time 0935    PT Stop Time 1013    PT Time Calculation (min) 38 min    Activity Tolerance Patient tolerated treatment well    Behavior During Therapy [facility] for tasks assessed/performed             Past Medical History:  Diagnosis Date   DDD (degenerative disc disease), lumbar     Past Surgical History:  Procedure Laterality Date   TONSILLECTOMY      There were no vitals filed for this visit.   Subjective Assessment - 03/11/22 0936     Subjective Patient reports her pain is improved, but still having pain in BUE, R>L. Activity still aggravates the pain, carrying heavy objects. Some days she can't handle even lighter weights.    Patient is accompained by: Interpreter    Pertinent History L4/5 disc    Diagnostic tests XR: Lower cervical spondylosis and facet hypertrophy; Chest CT negative, Rt shoulder xray negative    Patient Stated Goals to get rid of pain    Currently in Pain? Yes   Neck 6-7   Pain Location Hand    Pain Orientation Left;Right    Pain Descriptors / Indicators Tingling    Pain Type Acute pain    Pain Radiating Towards B hands and arms.    Pain Onset More than a month ago    Pain Frequency Intermittent    Aggravating Factors  using hands, carrying    Pain Onset 1 to 4 weeks ago                [facility] PT Assessment - 03/11/22 0001       AROM   Cervical Flexion 34    Cervical Extension 53    Cervical - Right Side Bend 25    Cervical - Left Side Bend 23    Cervical - Right Rotation mild limitation    Cervical  - Left Rotation mild limitation      Strength   Right Shoulder Flexion 4/5    Right Shoulder ABduction 4/5    Left Shoulder Flexion 4/5    Left Shoulder ABduction 4/5    Right Elbow Flexion 4+/5    Right Elbow Extension 4/5    Left Elbow Flexion 4+/5    Left Elbow Extension 3+/5      Palpation   Palpation comment Very tight Up traps, LS, scalenes, SCM, pects, mush tighter on R than L      Special Tests    Special Tests Cervical;Thoracic Outlet Syndrome    Cervical Tests Spurling's;Dictraction      Spurling's   Findings Negative    Side Left    Comment Neg B      Distraction Test   Findngs Positive    Comment B                           [facility] Adult PT Treatment/Exercise - 03/11/22 0001       Manual Therapy  Manual Therapy Soft tissue mobilization;Passive ROM    Soft tissue mobilization Gentle STM to up traps, LS, scalenes, SCM, pects, focused more on R as these were the really tight muscles    Passive ROM stretch to same muscles that received STM, along with R scapular depression and add.    Manual Traction cervical x 60 sec                     PT Education - 03/11/22 1119     Education Details Assessment findings, encouraged to perform HEP    Person(s) Educated Patient;Other (comment)   interpreter   Methods Explanation    Comprehension Verbalized understanding              PT Short Term Goals - 03/11/22 0942       PT SHORT TERM GOAL #1   Title Ind with initial HEP    Status Achieved      PT SHORT TERM GOAL #2   Status Achieved               PT Long Term Goals - 03/11/22 [id number]       PT LONG TERM GOAL #1   Title ind with HEP and progression    Status On-going      PT LONG TERM GOAL #2   Title decreased neck and chest pain by >= 90% with ADLs including lifting and carrying.    Baseline 03/11/22-Overall improved. It was constant before, now it is triggered with activity.    Status On-going      PT LONG TERM GOAL  #3   Title improved neck ROM to [facility] to peform ADLS    Baseline [id number]- remains impaired in all planes, painful    Status On-going      PT LONG TERM GOAL #4   Title Patient able to sleep without feeling N/T in bil UE    Baseline 03/11/22-Continues to C/O tingling in the morning    Status On-going      PT LONG TERM GOAL #5   Title BUE strength to improve to 5/5 and she will be able to complete functional cooking and home keeping tasks with pain no more than 4/10    Baseline 03/11/22-impaired due to pain    Status On-going                   Plan - 03/11/22 1110     Clinical Impression Statement Patient was seen for re-assessment and UPOC. She reprots overall improvement, but feels her tightness and pain in shoulders and her tingling and pain in hands has increased, R now > L. She is not consistently performing HEP, but is getting into the pool and using the floating barbells for resistance, reports that this increases pain. She is definitely tihgter on R than L at this point. and she appears to have some nerve entrapment as her tingling sensation increased in R hand iwth certain positions which increased muscular tension. Therapsit provided STM to tight tissues F/B gentle stretch. Educated the patient to consistently perform HEP, but very gently, trying to slowly increase the movement in her neck and shoulders. She will benefit from PT for further assessment of her neural tension and treatment for MS spasm. She would like to try DN again as well.    Stability/Clinical Decision Making Stable/Uncomplicated    Clinical Decision Making Low    Rehab Potential Excellent    PT Frequency 2x / week  PT Duration 4 weeks    PT Treatment/Interventions ADLs/Self Care Home Management;Electrical Stimulation;Cryotherapy;Moist Heat;Traction;Neuromuscular re-education;Patient/family education;Therapeutic exercise;Therapeutic activities;Manual techniques;Dry needling;Passive range of motion;Taping    PT  Next Visit Plan Further assess and treat neural tension in UE, possible 1st rib mos, STM and stretch, assess current HEP    PT Home Exercise Plan [facility]    Consulted and Agree with Plan of Care Patient             Patient will benefit from skilled therapeutic intervention in order to improve the following deficits and impairments:  Pain, Impaired UE functional use, Decreased strength, Decreased range of motion, Impaired flexibility, Decreased activity tolerance, Increased muscle spasms  Visit Diagnosis: Pain in right hand  Pain in left hand  Muscle weakness (generalized)  Other symptoms and signs involving the musculoskeletal system  Cramp and spasm  Cervicalgia     Problem List There are no problems to display for this patient.   Iona Beard, DPT 03/11/2022, 11:20 AM  [facility] [place] [facility] [address] [place], [place], [address] Phone: [phone]   Fax:  [phone]  Name: Amanda French MRN: [id number] Date of Birth: 1980/11/30

## 2022-03-12 ENCOUNTER — Encounter: Payer: Self-pay | Admitting: Physical Therapy

## 2022-03-12 ENCOUNTER — Ambulatory Visit: Payer: Self-pay | Admitting: Physical Therapy

## 2022-03-12 DIAGNOSIS — R252 Cramp and spasm: Secondary | ICD-10-CM

## 2022-03-12 DIAGNOSIS — M6281 Muscle weakness (generalized): Secondary | ICD-10-CM

## 2022-03-12 DIAGNOSIS — R29898 Other symptoms and signs involving the musculoskeletal system: Secondary | ICD-10-CM

## 2022-03-12 DIAGNOSIS — M542 Cervicalgia: Secondary | ICD-10-CM
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
## 2022-03-12 NOTE — Therapy (Deleted)
[facility] [place] [address] [place], [place], [address] Phone: [phone]   Fax:  [phone]  Physical Therapy Treatment  Patient Details  Name: Amanda French MRN: [id number] Date of Birth: October 12, 1980 Referring Provider (PT): Susy Frizzle, MD   Encounter Date: 03/12/2022         Past Medical History:  Diagnosis Date   DDD (degenerative disc disease), lumbar     Past Surgical History:  Procedure Laterality Date   TONSILLECTOMY      There were no vitals filed for this visit.         Treatment UBE L3 x6 Supnation with greenTB 2x10  Bicep curls supinated 3# 2x10  Tricep ext 10# 2x10 RUE only  Face pull to OHP 5# 2x10 Wrist flexion/ext w/elbow bent 2# 2x10                          PT Short Term Goals - 03/11/22 1962       PT SHORT TERM GOAL #1   Title Ind with initial HEP    Status Achieved      PT SHORT TERM GOAL #2   Status Achieved               PT Long Term Goals - 03/11/22 2297       PT LONG TERM GOAL #1   Title ind with HEP and progression    Status On-going      PT LONG TERM GOAL #2   Title decreased neck and chest pain by >= 90% with ADLs including lifting and carrying.    Baseline 03/11/22-Overall improved. It was constant before, now it is triggered with activity.    Status On-going      PT LONG TERM GOAL #3   Title improved neck ROM to [facility] to peform ADLS    Baseline 03/11/22- remains impaired in all planes, painful    Status On-going      PT LONG TERM GOAL #4   Title Patient able to sleep without feeling N/T in bil UE    Baseline 03/11/22-Continues to C/O tingling in the morning    Status On-going      PT LONG TERM GOAL #5   Title BUE strength to improve to 5/5 and she will be able to complete functional cooking and home keeping tasks with pain no more than 4/10    Baseline 03/11/22-impaired due to pain    Status On-going                          Patient will benefit from skilled therapeutic intervention in order to improve the following deficits and impairments:     Visit Diagnosis: No diagnosis found.     Problem List There are no problems to display for this patient.   Milinda Pointer, PT 03/12/2022, 10:26 AM  [facility] [place] [facility] [address] [place], [place], [address] Phone: [phone]   Fax:  [phone]  Name: Amanda French MRN: [id number] Date of Birth: [birth year]

## 2022-03-13 ENCOUNTER — Encounter: Payer: Self-pay | Admitting: Occupational Therapy

## 2022-03-13 ENCOUNTER — Ambulatory Visit: Payer: Self-pay | Admitting: Occupational Therapy

## 2022-03-13 DIAGNOSIS — M79641 Pain in right hand: Secondary | ICD-10-CM

## 2022-03-13 DIAGNOSIS — M79642 Pain in left hand: Secondary | ICD-10-CM

## 2022-03-13 DIAGNOSIS — M6281 Muscle weakness (generalized): Secondary | ICD-10-CM
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
## 2022-03-17 ENCOUNTER — Encounter: Payer: Self-pay | Admitting: Occupational Therapy

## 2022-03-17 ENCOUNTER — Ambulatory Visit: Payer: Self-pay | Admitting: Occupational Therapy

## 2022-03-17 DIAGNOSIS — M79642 Pain in left hand: Secondary | ICD-10-CM

## 2022-03-17 DIAGNOSIS — M6281 Muscle weakness (generalized): Secondary | ICD-10-CM

## 2022-03-17 DIAGNOSIS — M79641 Pain in right hand: Secondary | ICD-10-CM
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
## 2022-03-18 ENCOUNTER — Ambulatory Visit: Payer: Self-pay | Admitting: Rehabilitative and Restorative Service Providers"

## 2022-03-18 ENCOUNTER — Encounter: Payer: Self-pay | Admitting: Rehabilitative and Restorative Service Providers"

## 2022-03-18 DIAGNOSIS — R252 Cramp and spasm: Secondary | ICD-10-CM

## 2022-03-18 DIAGNOSIS — R29898 Other symptoms and signs involving the musculoskeletal system: Secondary | ICD-10-CM

## 2022-03-18 DIAGNOSIS — M79642 Pain in left hand: Secondary | ICD-10-CM

## 2022-03-18 DIAGNOSIS — M79641 Pain in right hand: Secondary | ICD-10-CM

## 2022-03-18 DIAGNOSIS — M6281 Muscle weakness (generalized): Secondary | ICD-10-CM

## 2022-03-18 DIAGNOSIS — M542 Cervicalgia: Secondary | ICD-10-CM

## 2022-03-18 NOTE — Therapy (Signed)
[facility] [place] [address] [place], [place], [address] Phone: [phone]   Fax:  [phone]  Physical Therapy Treatment  Patient Details  Name: Amanda French MRN: [id number] Date of Birth: 1980/12/08 Referring Provider (PT): Susy Frizzle, MD   Encounter Date: 03/18/2022   PT End of Session - 03/18/22 1410     Visit Number 19    Date for PT Re-Evaluation 04/08/22    PT Start Time 1409   pt late   PT Stop Time 1447    PT Time Calculation (min) 38 min    Activity Tolerance Patient tolerated treatment well             Past Medical History:  Diagnosis Date   DDD (degenerative disc disease), lumbar     Past Surgical History:  Procedure Laterality Date   TONSILLECTOMY      There were no vitals filed for this visit.    Subjective:  Arms are getting better. Still has some pain which increases with use of hands and arms  Pain: 4/10 Rt; 3/10 Lt  Bilat arms   Treatment:   Trigger Point Dry-Needling  Treatment instructions: Expect mild to moderate muscle soreness. S/S of pneumothorax if dry needled over a lung field, and to seek immediate medical attention should they occur. Patient verbalized understanding of these instructions and education.  Patient Consent Given: Yes Education handout provided: Previously provided Muscles treated: scaleni; extensor forearm bilat  Electrical stimulation performed: No Parameters: N/A Treatment response/outcome: decreased muscular tightness to palpation following DN  Manual: STM through bilat extensor forearms and ant/lat/post cervical musculature  Stretch for extensor forearm - elbow straight, wrist flexed, wrist in slight ulnar deviation 10-15 sec hold x 3 each side  Active wrist extension x 10 each side   Modalities:  Moist heat forearm and cervical spine x 10 min  TherEx: Encouraged patient to continue with consistent HEP   03/12/22: Upper trap and levator  stretches supine B Bicep curls 3 ways with 3# 10 each B Scapular retraction + ER red TB loop Periscap stability flexion x6 before increase in pain 3 way reaches on wall red TB x6 B increased pain Scapular retractions  Selfcare  03/12/22: Education on benefits of getting vs not getting NCS Importance of compliance with HEP  Encouraged exercise as tolerated at gym as long as pain is not increasing F/u with Dr. Denyse Amass if she is still interested in further imaging Nerve healing time       PT Short Term Goals - 03/11/22 0942       PT SHORT TERM GOAL #1   Title Ind with initial HEP    Status Achieved      PT SHORT TERM GOAL #2   Status Achieved               PT Long Term Goals - 03/11/22 6948       PT LONG TERM GOAL #1   Title ind with HEP and progression    Status On-going      PT LONG TERM GOAL #2   Title decreased neck and chest pain by >= 90% with ADLs including lifting and carrying.    Baseline 03/11/22-Overall improved. It was constant before, now it is triggered with activity.    Status On-going      PT LONG TERM GOAL #3   Title improved neck ROM to [facility] to peform ADLS    Baseline 03/11/22- remains impaired in all planes, painful  Status On-going      PT LONG TERM GOAL #4   Title Patient able to sleep without feeling N/T in bil UE    Baseline 03/11/22-Continues to C/O tingling in the morning    Status On-going      PT LONG TERM GOAL #5   Title BUE strength to improve to 5/5 and she will be able to complete functional cooking and home keeping tasks with pain no more than 4/10    Baseline 03/11/22-impaired due to pain    Status On-going                   Plan - 03/18/22 1158     Clinical Impression Statement Amanda French seen for DN and manual work for bilat UE's through extensor forearms and cervical spine musculature. Tolerated DN fairly well. Continued with manual work and passive stretch for extensor forearms. Encouraged patient to continue with  consistent exercises.    Stability/Clinical Decision Making Stable/Uncomplicated    Clinical Decision Making Low    Rehab Potential Good    PT Frequency 2x / week    PT Duration 4 weeks    PT Treatment/Interventions ADLs/Self Care Home Management;Electrical Stimulation;Cryotherapy;Moist Heat;Traction;Neuromuscular re-education;Patient/family education;Therapeutic exercise;Therapeutic activities;Manual techniques;Dry needling;Passive range of motion;Taping    PT Next Visit Plan Per primary PT 03/12/22 - continue working on STM to scalenes/first rib/upper traps, neural tension in UE, strength. Is HEP compliance better?    PT Home Exercise Plan [facility]    Consulted and Agree with Plan of Care Patient             Patient will benefit from skilled therapeutic intervention in order to improve the following deficits and impairments:     Visit Diagnosis: Pain in right hand  Pain in left hand  Muscle weakness (generalized)  Other symptoms and signs involving the musculoskeletal system  Cramp and spasm  Cervicalgia     Problem List There are no problems to display for this patient.  Amanda French PT, MPH 03/18/22 3:37 PM   Supplemental Physical Therapist [facility]       [facility] [place] [address] [place], [place], [address] Phone: [phone]   Fax:  [phone]  Name: Amanda French MRN: [id number] Date of Birth: 11-21-1980

## 2022-03-24 ENCOUNTER — Ambulatory Visit: Payer: Self-pay | Admitting: Occupational Therapy

## 2022-03-24 DIAGNOSIS — M79641 Pain in right hand: Secondary | ICD-10-CM

## 2022-03-24 DIAGNOSIS — M79642 Pain in left hand: Secondary | ICD-10-CM

## 2022-03-24 DIAGNOSIS — M6281 Muscle weakness (generalized): Secondary | ICD-10-CM
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
## 2022-04-01 ENCOUNTER — Ambulatory Visit: Payer: Self-pay

## 2022-04-01 DIAGNOSIS — R29898 Other symptoms and signs involving the musculoskeletal system: Secondary | ICD-10-CM

## 2022-04-01 DIAGNOSIS — M6281 Muscle weakness (generalized): Secondary | ICD-10-CM

## 2022-04-01 DIAGNOSIS — M542 Cervicalgia: Secondary | ICD-10-CM

## 2022-04-01 DIAGNOSIS — M79641 Pain in right hand: Secondary | ICD-10-CM

## 2022-04-01 NOTE — Therapy (Signed)
[place]. [place], [place], [address] Phone: [phone]   Fax:  [phone]  Physical Therapy Treatment  Patient Details  Name: Amanda French MRN: [id number] Date of Birth: 02/26/1981 Referring Provider (PT): Calvert Cantor, MD   Encounter Date: 04/01/2022   PT End of Session - 04/01/22 1412     Visit Number 20    Date for PT Re-Evaluation 04/08/22    PT Start Time 3704    PT Stop Time 1445    PT Time Calculation (min) 34 min    Activity Tolerance Patient tolerated treatment well              Past Medical History:  Diagnosis Date   DDD (degenerative disc disease), lumbar     Past Surgical History:  Procedure Laterality Date   TONSILLECTOMY      There were no vitals filed for this visit.    Subjective:  Doing better, still have some tightness in my neck and pain is mostly in my hands.   Pain: 2/10  TherEx: 04/01/22 ER with scapular retraction red 2x10  Horizontal ABD redTB 2x10  Shoulder flexion and ABD 3# 2x10 Bicep curls 3 ways 3# x10 each STM to right UT and passive stretching   03/12/22: Upper trap and levator stretches supine B Bicep curls 3 ways with 3# 10 each B Scapular retraction + ER red TB loop Periscap stability flexion x6 before increase in pain 3 way reaches on wall red TB x6 B increased pain Scapular retractions  Selfcare  03/12/22: Education on benefits of getting vs not getting NCS Importance of compliance with HEP  Encouraged exercise as tolerated at gym as long as pain is not increasing F/u with Dr. Georgina Snell if she is still interested in further imaging Nerve healing time       PT Short Term Goals - 03/11/22 0942       PT SHORT TERM GOAL #1   Title Ind with initial HEP    Status Achieved      PT SHORT TERM GOAL #2   Status Achieved               PT Long Term Goals - 04/01/22 1416       PT LONG TERM GOAL #1   Title ind with HEP and  progression    Status On-going      PT LONG TERM GOAL #2   Title decreased neck and chest pain by >= 90% with ADLs including lifting and carrying.    Baseline 03/11/22-Overall improved. It was constant before, now it is triggered with activity.    Status On-going      PT LONG TERM GOAL #3   Title improved neck ROM to [facility] to peform ADLS    Baseline 8/88/91- some mild tightness at end range with left rotation    Status Partially Met      PT LONG TERM GOAL #4   Title Patient able to sleep without feeling N/T in bil UE    Baseline 04/01/22 tingling in the morning sometimes but it's better    Status Partially Met      PT LONG TERM GOAL #5   Title BUE strength to improve to 5/5 and she will be able to complete functional cooking and home keeping tasks with pain no more than 4/10    Baseline 04/01/22- 2/10 pain    Status Partially Met  Plan - 03/18/22 1158     Clinical Impression Statement Amanda French seen for DN and manual work for bilat UE's through extensor forearms and cervical spine musculature. Tolerated DN fairly well. Continued with manual work and passive stretch for extensor forearms. Encouraged patient to continue with consistent exercises.    Stability/Clinical Decision Making Stable/Uncomplicated    Clinical Decision Making Low    Rehab Potential Good    PT Frequency 2x / week    PT Duration 4 weeks    PT Treatment/Interventions ADLs/Self Care Home Management;Electrical Stimulation;Cryotherapy;Moist Heat;Traction;Neuromuscular re-education;Patient/family education;Therapeutic exercise;Therapeutic activities;Manual techniques;Dry needling;Passive range of motion;Taping    PT Next Visit Plan Per primary PT 03/12/22 - continue working on STM to scalenes/first rib/upper traps, neural tension in UE, strength. Is HEP compliance better?    PT Home Exercise Plan [facility]    Consulted and Agree with Plan of Care Patient             Patient will benefit from  skilled therapeutic intervention in order to improve the following deficits and impairments:  Pain, Impaired UE functional use, Decreased strength, Decreased range of motion, Impaired flexibility, Decreased activity tolerance, Increased muscle spasms  Visit Diagnosis: Pain in right hand  Muscle weakness (generalized)  Other symptoms and signs involving the musculoskeletal system  Cervicalgia     Problem List There are no problems to display for this patient.  Celyn P. Helene Kelp PT, MPH 04/01/22 2:52 PM   Supplemental Physical Therapist [place]   PHYSICAL THERAPY DISCHARGE SUMMARY  Visits from Start of Care: 20  Patient agrees to discharge. Patient goals were partially met. Patient is being discharged due to maximized rehab potential.      [address]. [place], [place], [address] Phone: [phone]   Fax:  [phone]  Name: Amanda French MRN: [id number] Date of Birth: June 05, 1981

## 2022-04-02 ENCOUNTER — Ambulatory Visit: Payer: Self-pay | Admitting: Occupational Therapy

## 2022-04-02 ENCOUNTER — Encounter: Payer: Self-pay | Admitting: Occupational Therapy

## 2022-04-02 DIAGNOSIS — M79641 Pain in right hand: Secondary | ICD-10-CM

## 2022-04-02 DIAGNOSIS — M6281 Muscle weakness (generalized): Secondary | ICD-10-CM

## 2022-04-02 DIAGNOSIS — M79642 Pain in left hand: Secondary | ICD-10-CM
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
## 2022-08-22 ENCOUNTER — Ambulatory Visit: Payer: Self-pay | Admitting: Internal Medicine

## 2022-08-22 VITALS — BP 108/68 | HR 87 | Temp 98.6°F | Resp 16 | Ht 65.0 in | Wt 184.9 lb

## 2022-08-22 DIAGNOSIS — R1032 Left lower quadrant pain: Secondary | ICD-10-CM

## 2022-08-22 NOTE — Progress Notes (Signed)
New Patient Office Visit  Subjective    Patient ID: Amanda French, female    DOB: 1981-05-12  Age: 42 y.o. MRN: [id number]  CC:  Chief Complaint  Patient presents with   Abdominal Pain    2 weeks pain in lower hip waist, left side    HPI Amanda French presents to establish care. C/o left lower abdominal pan and tightness x 2 wks, denies trauma.   Has not used any analgesia,heating pad or topical agents.  Outpatient Encounter Medications as of 08/22/2022  Medication Sig   [DISCONTINUED] lidocaine (LIDODERM) 5 % Place 1 patch onto the skin daily. Remove & Discard patch within 12 hours or as directed by MD   [DISCONTINUED] methocarbamol (ROBAXIN) 500 MG tablet Take 1 tablet (500 mg total) by mouth 2 (two) times daily.   [DISCONTINUED] naproxen (NAPROSYN) 500 MG tablet Take 1 tablet (500 mg total) by mouth 2 (two) times daily.   No facility-administered encounter medications on file as of 08/22/2022.    Past Medical History:  Diagnosis Date   DDD (degenerative disc disease), lumbar     Past Surgical History:  Procedure Laterality Date   TONSILLECTOMY      No family history on file.  Social History   Socioeconomic History   Marital status: Married    Spouse name: Not on file   Number of children: Not on file   Years of education: Not on file   Highest education level: Not on file  Occupational History   Not on file  Tobacco Use   Smoking status: Never    Passive exposure: Never   Smokeless tobacco: Never  Vaping Use   Vaping Use: Never used  Substance and Sexual Activity   Alcohol use: Never   Drug use: Never   Sexual activity: Not on file  Other Topics Concern   Not on file  Social History Narrative   Not on file   Social Determinants of Health   Financial Resource Strain: Not on file  Food Insecurity: Not on file  Transportation Needs: Not on file  Physical Activity: Not on file  Stress: Not on file  Social Connections: Not on file  Intimate Partner  Violence: Not on file    Review of Systems  All other systems reviewed and are negative. No LMP recorded. 3 wks ago      Objective    BP 108/68 (BP Location: Right Arm, Patient Position: Sitting, Cuff Size: Normal)   Pulse 87   Temp 98.6 F (37 C) (Oral)   Resp 16   Ht 5' 5"$  (1.651 m)   Wt 184 lb 14.4 oz (83.9 kg)   SpO2 98%   BMI 30.77 kg/m   Physical Exam Vitals reviewed.  Constitutional:      General: She is not in acute distress.    Appearance: She is obese.  HENT:     Head: Normocephalic.     Nose: Nose normal.     Mouth/Throat:     Mouth: Mucous membranes are moist.  Eyes:     Extraocular Movements: Extraocular movements intact.     Pupils: Pupils are equal, round, and reactive to light.  Cardiovascular:     Rate and Rhythm: Normal rate and regular rhythm.     Heart sounds: No murmur heard. Pulmonary:     Effort: Pulmonary effort is normal.     Breath sounds: No rhonchi or rales.  Abdominal:     General: Abdomen is flat. There is no  distension.     Palpations: Abdomen is soft. There is no hepatomegaly, splenomegaly or mass.     Tenderness: There is abdominal tenderness in the left lower quadrant. There is no guarding or rebound.  Musculoskeletal:        General: Normal range of motion.     Cervical back: Normal range of motion. No tenderness.  Skin:    General: Skin is warm and dry.  Neurological:     General: No focal deficit present.     Mental Status: She is alert and oriented to person, place, and time.     Cranial Nerves: No cranial nerve deficit.     Motor: No weakness.  Psychiatric:        Mood and Affect: Mood normal.        Behavior: Behavior normal.         Assessment & Plan:   Problem List Items Addressed This Visit   None 1. Left lower quadrant abdominal pain - CBC With Diff/Platelet - Comprehensive metabolic panel - Urinalysis - CT Abdomen Pelvis W Contrast; Future    No follow-ups on file.   Volanda Napoleon,  MD

## 2022-08-25 LAB — COMPREHENSIVE METABOLIC PANEL
ALT: 16 IU/L (ref 0–32)
AST: 17 IU/L (ref 0–40)
Albumin/Globulin Ratio: 1.9 (ref 1.2–2.2)
Albumin: 4.4 g/dL (ref 3.9–4.9)
Alkaline Phosphatase: 43 IU/L — ABNORMAL LOW (ref 44–121)
BUN/Creatinine Ratio: 10 (ref 9–23)
BUN: 6 mg/dL (ref 6–24)
Bilirubin Total: 0.6 mg/dL (ref 0.0–1.2)
CO2: 20 mmol/L (ref 20–29)
Calcium: 8.9 mg/dL (ref 8.7–10.2)
Chloride: 105 mmol/L (ref 96–106)
Creatinine, Ser: 0.6 mg/dL (ref 0.57–1.00)
Globulin, Total: 2.3 g/dL (ref 1.5–4.5)
Glucose: 86 mg/dL (ref 70–99)
Potassium: 3.9 mmol/L (ref 3.5–5.2)
Sodium: 139 mmol/L (ref 134–144)
Total Protein: 6.7 g/dL (ref 6.0–8.5)
eGFR: 114 mL/min/{1.73_m2} (ref >59)

## 2022-08-25 LAB — CBC WITH DIFF/PLATELET
Basophils Absolute: 0 10*3/uL (ref 0.0–0.2)
Basos: 1 %
EOS (ABSOLUTE): 0.1 10*3/uL (ref 0.0–0.4)
Eos: 1 %
Hematocrit: 39.4 % (ref 34.0–46.6)
Hemoglobin: 12.9 g/dL (ref 11.1–15.9)
Immature Grans (Abs): 0 10*3/uL (ref 0.0–0.1)
Immature Granulocytes: 0 %
Lymphocytes Absolute: 1.9 10*3/uL (ref 0.7–3.1)
Lymphs: 35 %
MCH: 26.7 pg (ref 26.6–33.0)
MCHC: 32.7 g/dL (ref 31.5–35.7)
MCV: 81 fL (ref 79–97)
Monocytes Absolute: 0.4 10*3/uL (ref 0.1–0.9)
Monocytes: 6 %
Neutrophils Absolute: 3.2 10*3/uL (ref 1.4–7.0)
Neutrophils: 57 %
Platelets: 229 10*3/uL (ref 150–450)
RBC: 4.84 x10E6/uL (ref 3.77–5.28)
RDW: 13.5 % (ref 11.7–15.4)
WBC: 5.6 10*3/uL (ref 3.4–10.8)

## 2022-08-25 LAB — URINALYSIS
Bilirubin, UA: NEGATIVE
Glucose, UA: NEGATIVE
Ketones, UA: NEGATIVE
Nitrite, UA: NEGATIVE
Protein,UA: NEGATIVE
Specific Gravity, UA: 1.014 (ref 1.005–1.030)
Urobilinogen, Ur: 0.2 mg/dL (ref 0.2–1.0)
pH, UA: 6 (ref 5.0–7.5)

## 2022-09-05 ENCOUNTER — Encounter: Payer: Self-pay | Admitting: Internal Medicine

## 2022-09-05 ENCOUNTER — Ambulatory Visit: Payer: Self-pay | Admitting: Internal Medicine

## 2022-09-05 VITALS — BP 116/75 | HR 86 | Temp 98.3°F | Ht 64.96 in | Wt 185.0 lb

## 2022-09-05 DIAGNOSIS — M7612 Psoas tendinitis, left hip: Secondary | ICD-10-CM

## 2022-09-05 NOTE — Progress Notes (Signed)
  [facility]  Internal MEDICINE  Office Visit Note  Patient Name: Amanda French  [id number]  [id number]  Date of Service: 11/07/2022  Chief Complaint  Patient presents with   Follow-up    Abnormal Lab    HPI  Pt is here to discuss labs, c/o hip pain    Current Medication: No outpatient encounter medications on file as of 09/05/2022.   No facility-administered encounter medications on file as of 09/05/2022.    Surgical History: Past Surgical History:  Procedure Laterality Date   TONSILLECTOMY      Medical History: Past Medical History:  Diagnosis Date   DDD (degenerative disc disease), lumbar     Family History: History reviewed. No pertinent family history.  Social History   Socioeconomic History   Marital status: Married    Spouse name: Not on file   Number of children: Not on file   Years of education: Not on file   Highest education level: Not on file  Occupational History   Not on file  Tobacco Use   Smoking status: Never    Passive exposure: Never   Smokeless tobacco: Never  Vaping Use   Vaping Use: Never used  Substance and Sexual Activity   Alcohol use: Never   Drug use: Never   Sexual activity: Not on file  Other Topics Concern   Not on file  Social History Narrative   Not on file   Social Determinants of Health   Financial Resource Strain: Not on file  Food Insecurity: Not on file  Transportation Needs: Not on file  Physical Activity: Not on file  Stress: Not on file  Social Connections: Not on file  Intimate Partner Violence: Not on file      Review of Systems  Constitutional:  Negative for fatigue and fever.  HENT:  Negative for congestion, mouth sores and postnasal drip.   Respiratory:  Negative for cough.   Cardiovascular:  Negative for chest pain.  Genitourinary:  Negative for flank pain.  Musculoskeletal:  Positive for arthralgias and back pain.  Psychiatric/Behavioral: Negative.      Vital Signs: BP 116/75 (BP  Location: Left Arm, Patient Position: Sitting)   Pulse 86   Temp 98.3 F (36.8 C)   Ht 5' 4.96" (1.65 m)   Wt 185 lb (83.9 kg)   SpO2 98%   BMI 30.82 kg/m    Physical Exam Musculoskeletal:        General: Tenderness present.        Assessment/Plan: 1. Psoas tendonitis of left side Pt is instructed to o stretching exercises, if no improvement might need xray or pelvic u/s     General Counseling: Amanda French verbalizes understanding of the findings of todays visit and agrees with plan of treatment. I have discussed any further diagnostic evaluation that may be needed or ordered today. We also reviewed her medications today. she has been encouraged to call the office with any questions or concerns that should arise related to todays visit.    No orders of the defined types were placed in this encounter.   No orders of the defined types were placed in this encounter.   Total time spent:15 Minutes Time spent includes review of chart, medications, test results, and follow up plan with the patient.   [place] Controlled Substance Database was reviewed by me.   Dr Lyndon Code Internal medicine

## 2022-09-19 ENCOUNTER — Ambulatory Visit: Payer: Self-pay | Admitting: Internal Medicine

## 2022-12-05 ENCOUNTER — Ambulatory Visit: Payer: Self-pay | Admitting: Internal Medicine

## 2022-12-05 ENCOUNTER — Encounter: Payer: Self-pay | Admitting: Internal Medicine

## 2022-12-05 VITALS — BP 115/82 | HR 75 | Temp 98.6°F | Ht 68.0 in | Wt 182.0 lb

## 2022-12-05 DIAGNOSIS — M542 Cervicalgia: Secondary | ICD-10-CM

## 2022-12-05 NOTE — Progress Notes (Incomplete)
Patient was seen today at [facility] for concerns noted as below.   Subjective:   Patient ID: Amanda French, female DOB: 1981-03-08 42 y.o. MRN: [id number]  WUJ:[id number] referral to do pap test also referral for physical therapy for the neck pain.  Patient want to discuss why she can't loose wheight .  Social History   Tobacco Use   Smoking status: Never    Passive exposure: Never   Smokeless tobacco: Never  Vaping Use   Vaping Use: Never used  Substance Use Topics   Alcohol use: Never   Drug use: Never    Review of Systems: Pertinent items are noted in HPI.  Labs Reviewed: *** Lab Results  Component Value Date   WBC 5.6 08/24/2022   HGB 12.9 08/24/2022   HCT 39.4 08/24/2022   MCV 81 08/24/2022   PLT 229 08/24/2022       Latest Ref Rng & Units 08/24/2022    1:29 PM 12/23/2021   10:43 PM 10/11/2021    4:52 AM  CMP  Glucose 70 - 99 mg/dL 86  94  147   BUN 6 - 24 mg/dL 6  8  14    Creatinine 0.57 - 1.00 mg/dL 8.29  5.62  1.30   Sodium 134 - 144 mmol/L 139  137  138   Potassium 3.5 - 5.2 mmol/L 3.9  4.3  3.7   Chloride 96 - 106 mmol/L 105  110  108   CO2 20 - 29 mmol/L 20  22  23    Calcium 8.7 - 10.2 mg/dL 8.9  8.5  8.6   Total Protein 6.0 - 8.5 g/dL 6.7     Total Bilirubin 0.0 - 1.2 mg/dL 0.6     Alkaline Phos 44 - 121 IU/L 43     AST 0 - 40 IU/L 17     ALT 0 - 32 IU/L 16         Reviewed Chart Review for last notes.    Objective:   Vitals:   12/05/22 1014  BP: 115/82  Pulse: 75  Temp: 98.6 F (37 C)  TempSrc: Oral  SpO2: 98%  Weight: 182 lb (82.6 kg)  Height: 5\' 8"  (1.727 m)   Physical Exam: General: ***, pleasant  Assessment & Plan:  Medications: Labs:  Follow up:

## 2022-12-05 NOTE — Progress Notes (Unsigned)
   Established Patient Office Visit  Subjective   Patient ID: Amanda French, female    DOB: Jan 19, 1981  Age: 42 y.o. MRN: [id number]  Chief Complaint  Patient presents with   Follow-up    For neck pain, and ref to an OBGYN     HPI  Has pain on R side of her neckfor about a month, worse with neck movement. Not better with pain meds.   ROS    Objective:     BP 115/82 (BP Location: Left Arm, Patient Position: Sitting, Cuff Size: Normal)   Pulse 75   Temp 98.6 F (37 C) (Oral)   Ht 5\' 8"  (1.727 m)   Wt 182 lb (82.6 kg)   SpO2 98%   BMI 27.67 kg/m  {Vitals History (Optional):23777}  Physical Exam  CV- RRR PULM- CTA bilaterally  Neck- No point tenderness  No results found for any visits on 12/05/22.  {Labs (Optional):23779}  The ASCVD Risk score (Arnett DK, et al., 2019) failed to calculate for the following reasons:   Cannot find a previous HDL lab   Cannot find a previous total cholesterol lab    Assessment & Plan:   Problem List Items Addressed This Visit       Other   Neck pain - Primary   PLAN-  OB/GYN Referral Mammogram  Physiotherapy for neck   No follow-ups on file.    Jestina Stephani

## 2022-12-24 ENCOUNTER — Other Ambulatory Visit: Payer: Self-pay

## 2022-12-24 DIAGNOSIS — Z1231 Encounter for screening mammogram for malignant neoplasm of breast: Secondary | ICD-10-CM

## 2022-12-31 ENCOUNTER — Encounter (HOSPITAL_BASED_OUTPATIENT_CLINIC_OR_DEPARTMENT_OTHER): Payer: Self-pay

## 2022-12-31 ENCOUNTER — Inpatient Hospital Stay: Payer: Self-pay | Attending: Hematology & Oncology | Admitting: *Deleted

## 2022-12-31 ENCOUNTER — Ambulatory Visit (HOSPITAL_BASED_OUTPATIENT_CLINIC_OR_DEPARTMENT_OTHER)
Admission: RE | Admit: 2022-12-31 | Discharge: 2022-12-31 | Disposition: A | Discharge status: Home / Self Care | Payer: Self-pay | Source: Ambulatory Visit | Attending: Obstetrics and Gynecology | Admitting: Obstetrics and Gynecology

## 2022-12-31 VITALS — BP 110/80 | Wt 182.0 lb

## 2022-12-31 DIAGNOSIS — N6452 Nipple discharge: Secondary | ICD-10-CM

## 2022-12-31 DIAGNOSIS — Z1231 Encounter for screening mammogram for malignant neoplasm of breast: Secondary | ICD-10-CM

## 2022-12-31 DIAGNOSIS — Z124 Encounter for screening for malignant neoplasm of cervix: Secondary | ICD-10-CM | POA: Insufficient documentation

## 2022-12-31 DIAGNOSIS — Z01419 Encounter for gynecological examination (general) (routine) without abnormal findings: Secondary | ICD-10-CM

## 2022-12-31 NOTE — Patient Instructions (Signed)
Explained breast self awareness with Amanda French. Pap smear completed today. Let her know [facility] will cover Pap smears and HPV typing every 5 years unless has a history of abnormal Pap smears. Referred patient to the [facility] for a screening mammogram per recommendation. Appointment scheduled Thursday, December 31, 2022 at 1020. Patient aware of appointment and will be there. Let patient know will follow up with her within the next couple weeks with results of Pap smear by letter or phone. Informed patient that the [facility] will follow up with her within the next couple of weeks with results of her mammogram by letter or phone. Amanda French verbalized understanding.  Amanda French, Amanda Maser, RN 11:01 AM

## 2022-12-31 NOTE — Progress Notes (Signed)
[name] is a 42 y.o. No obstetric history on file. female who presents to [facility] clinic today with complaint of right breast yellowish/green discharge when expressed x 10 years. Patient stated she followed up in [place]. Patient shared copies of her imaging reports and imaging. Last mammogram completed 01/16/2020 was benign with a screening mammogram recommended in one year.   Pap Smear: Pap smear completed today. Last Pap smear was 3-4 years ago at a clinic in [place] and was normal per patient. Per patient has no history of an abnormal Pap smear. Last Pap smear result is not available in Epic.   Physical exam: Breasts Right breast slightly larger than left that per patient is normal for her. No skin abnormalities bilateral breasts. No nipple retraction right breast. Left nipple slightly inverted that per patient is normal for her. No nipple discharge left breast. Expressed a greenish colored discharge from the right breast on exam. Sample of discharge sent to Cytology for evaluation. No lymphadenopathy. No lumps palpated bilateral breasts. No complaints of pain or tenderness on exam.      Pelvic/Bimanual Ext Genitalia No lesions, no swelling and no discharge observed on external genitalia.        Vagina Vagina pink and normal texture. No lesions or discharge observed in vagina.        Cervix Cervix is present. Cervix pink and of normal texture. No discharge observed. IUD strings visualized.   Uterus Uterus is present and palpable. Uterus in normal position and normal size.        Adnexae Bilateral ovaries present and palpable. No tenderness on palpation.         Rectovaginal No rectal exam completed today since patient had no rectal complaints. No skin abnormalities observed on exam.     Smoking History: Patient has never smoked.   Patient Navigation: Patient education provided. Access to services provided for patient through [facility] program. Arabic interpreter Amanda French from  [facility] provided.    Breast and Cervical Cancer Risk Assessment: Patient does not have family history of breast cancer, known genetic mutations, or radiation treatment to the chest before age 34. Patient does not have history of cervical dysplasia, immunocompromised, or DES exposure in-utero.  Risk Scores as of 12/31/2022     Amanda French           5-year 0.55 %   Lifetime 7.07 %   Because the patient's race/ethnicity is unknown, the model is using data for white patients and might not be accurate.         Last calculated by Amanda French, CMA on 12/31/2022 at 10:15 AM        A: B[facility]CP exam with pap smear Complaint of right breast discharge x 10 years.  P: Referred patient to the [facility] for a screening mammogram per recommendation. Appointment scheduled Thursday, December 31, 2022 at 1020.  Amanda Heidelberg, RN 12/31/2022 11:01 AM

## 2023-01-01 LAB — CYTOLOGY - NON PAP

## 2023-01-04 LAB — CYTOLOGY - PAP
Comment: NEGATIVE
Diagnosis: NEGATIVE
High risk HPV: NEGATIVE

## 2023-01-05 ENCOUNTER — Telehealth: Payer: Self-pay

## 2023-01-05 NOTE — Telephone Encounter (Signed)
Called patient via [employer] [phone] to give pap smear results. Informed patient that pap smear was normal and HPV was negative. Based on this result her next pap smear will be due in 5 years. Patient voiced understanding.  Informed patient that breast discharge sample that we collected was also benign. Patient voiced understanding.

## 2023-04-03 IMAGING — MR MR CERVICAL SPINE W/O CM
5 series · 41 of 48 positions shown · non-contrast
Comparison: Cervical spine radiographs November 25, 2021.

CLINICAL DATA: Myelopathy, acute, cervical spine

EXAM:
MRI CERVICAL SPINE WITHOUT CONTRAST
TECHNIQUE: Multiplanar, multisequence MR imaging of the cervical spine was
performed. No intravenous contrast was administered.

[Series 2: T2 · sagittal · 3.0mm · 0.86mm/px · 6 of 13 slices shown (1 of 2)]
[im 1/13]
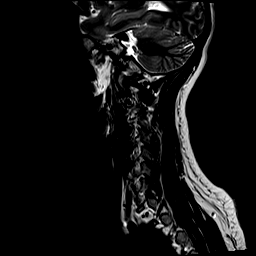
[im 3/13]
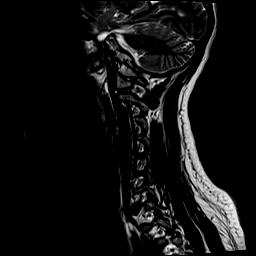
[im 5/13]
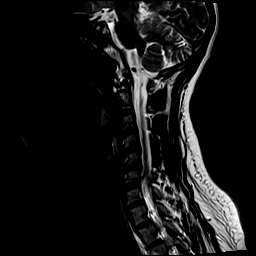
[im 8/13]
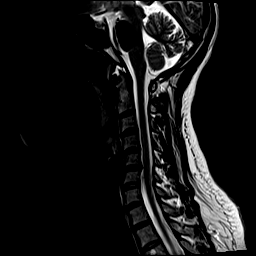
[im 10/13]
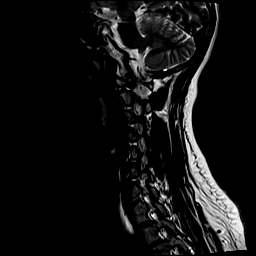
[im 13/13]
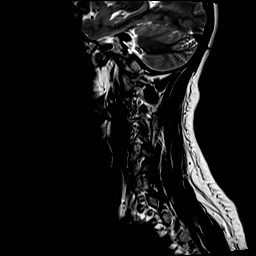

[Series 3: T1 · sagittal · 3.0mm · 0.86mm/px · 6 of 13 slices shown]
[im 1/13]
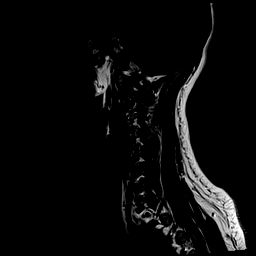
[im 3/13]
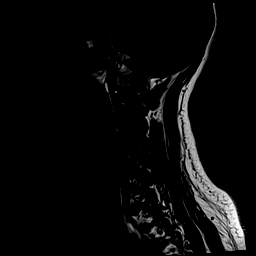
[im 5/13]
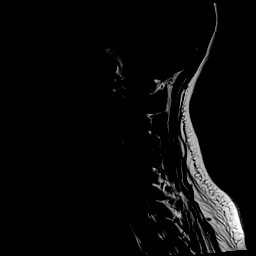
[im 8/13]
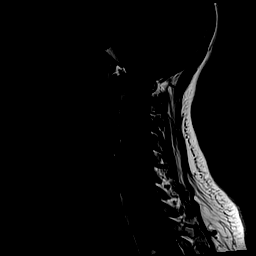
[im 10/13]
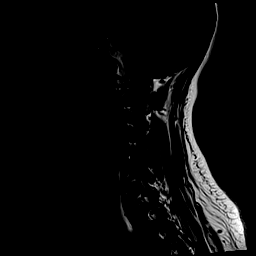
[im 13/13]
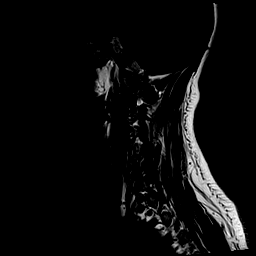

[Series 4: STIR · sagittal · 3.0mm · 0.69mm/px · 6 of 13 slices shown]
[im 1/13]
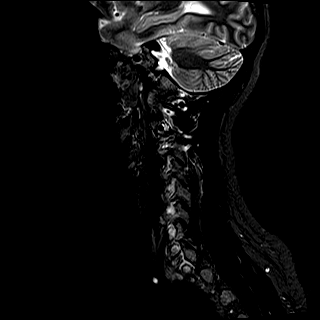
[im 3/13]
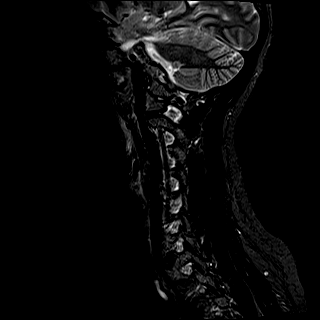
[im 5/13]
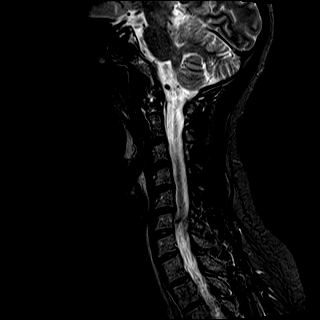
[im 8/13]
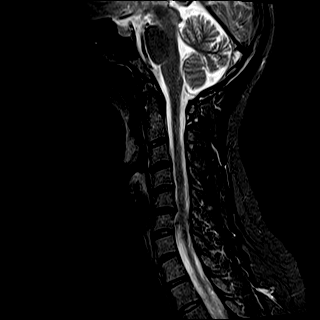
[im 10/13]
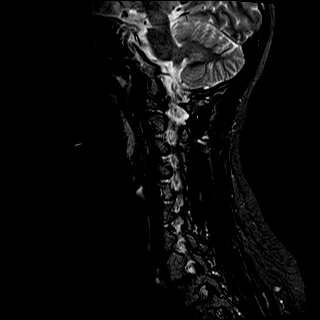
[im 13/13]
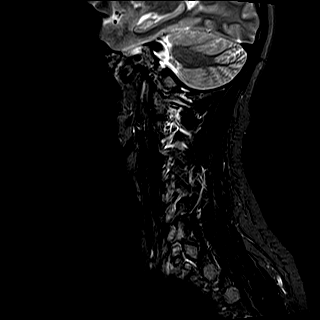

[Series 5: T2 · axial · 3.0mm · 0.70mm/px · z∈[-54,+49]mm · 15 of 31 slices shown (2 of 2)]
[im 1/31]
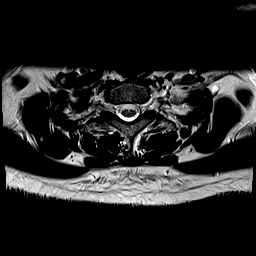
[im 3/31]
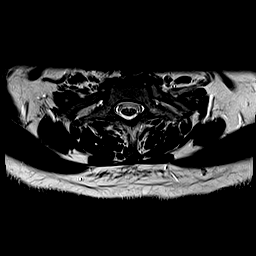
[im 5/31]
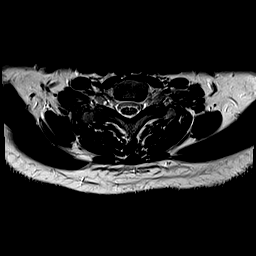
[im 7/31]
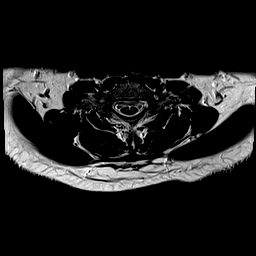
[im 9/31]
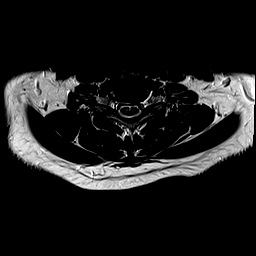
[im 11/31]
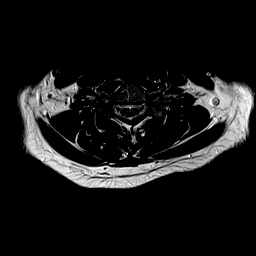
[im 13/31]
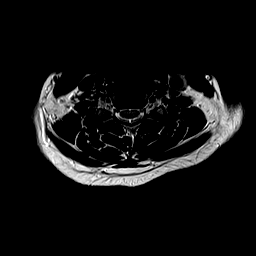
[im 16/31]
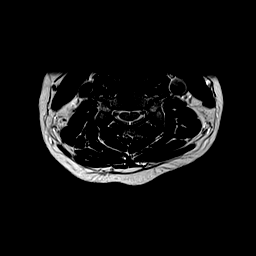
[im 18/31]
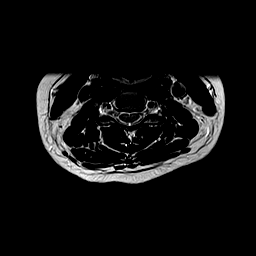
[im 20/31]
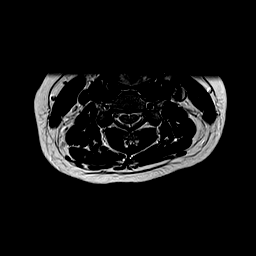
[im 22/31]
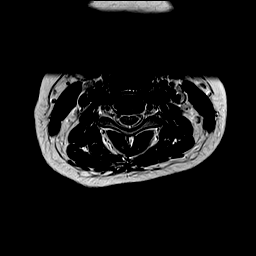
[im 24/31]
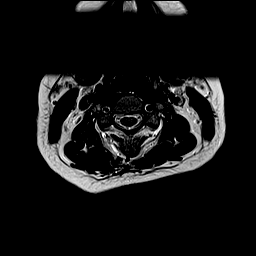
[im 26/31]
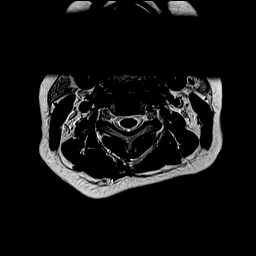
[im 28/31]
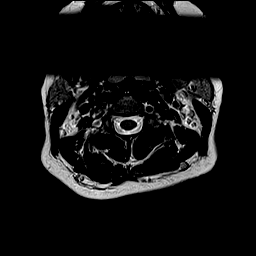
[im 31/31]
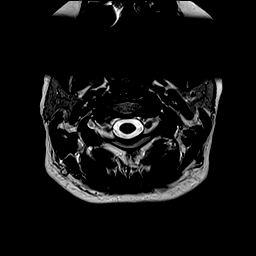

[Series 6: ax mpgr · axial · 3.0mm · 0.35mm/px · z∈[-54,+49]mm · 8 of 31 slices shown]
[im 1/31]
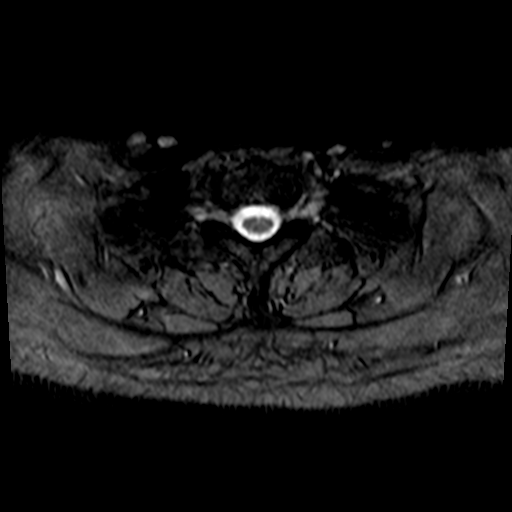
[im 5/31]
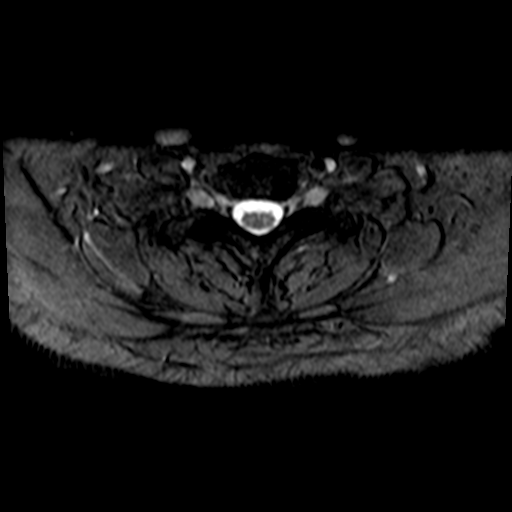
[im 9/31]
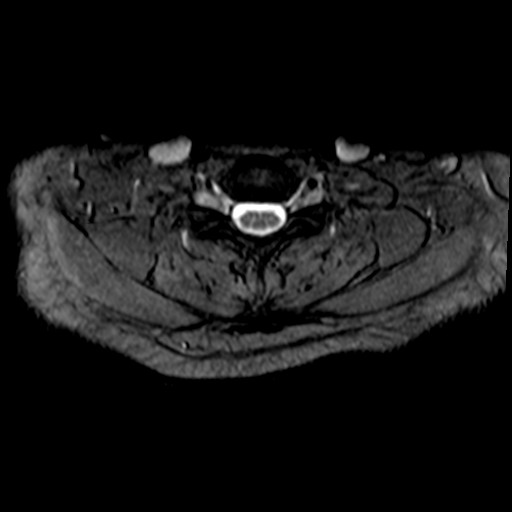
[im 13/31]
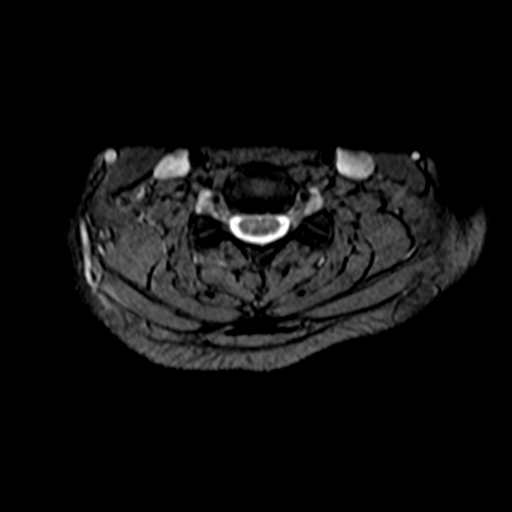
[im 18/31]
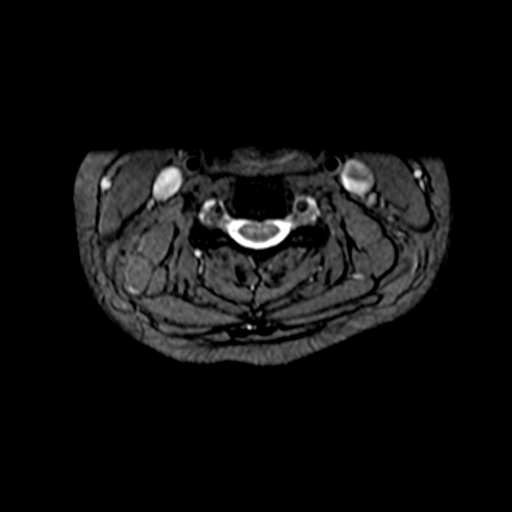
[im 22/31]
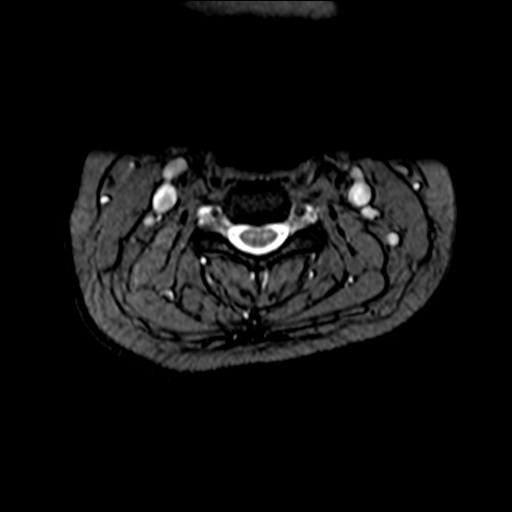
[im 26/31]
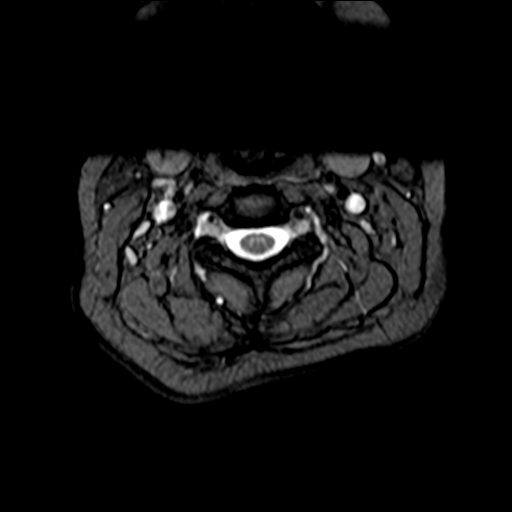
[im 31/31]
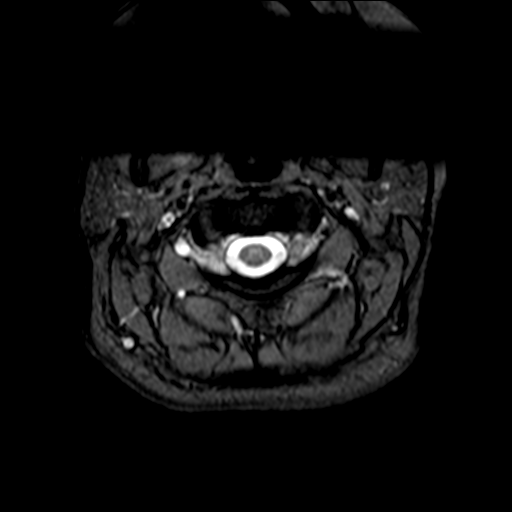

[41 of 48 positions shown; findings below may reference images not displayed]

FINDINGS: Alignment: Mild reversal of normal cervical lordosis. No substantial
sagittal subluxation.

Vertebrae: Body maintained. No focal marrow edema to suggest acute
fracture discitis/osteomyelitis. No suspicious bone lesions.

Cord: Normal signal.

Posterior Fossa, vertebral arteries, paraspinal tissues: Visualized
vertebral artery flow voids are maintained. No paraspinal edema. No
acute findings in the visualized posterior fossa.

Disc levels:

C2-C3: No significant disc protrusion, foraminal stenosis, or canal
stenosis.

C3-C4: No significant disc protrusion, foraminal stenosis, or canal
stenosis.

C4-C5: No significant disc protrusion, foraminal stenosis, or canal
stenosis.

C5-C6: Small right paracentral disc protrusion mildly effaces
ventral CSF without significant stenosis.

C6-C7: No significant disc protrusion, foraminal stenosis, or canal
stenosis.

C7-T1: No significant disc protrusion, foraminal stenosis, or canal
stenosis.
IMPRESSION: Mild degenerative change at C5-C6 without significant stenosis.

## 2023-04-08 ENCOUNTER — Telehealth: Payer: Self-pay | Admitting: Hematology and Oncology

## 2024-05-05 ENCOUNTER — Other Ambulatory Visit: Payer: Self-pay | Admitting: Family Medicine

## 2024-05-05 DIAGNOSIS — Z1231 Encounter for screening mammogram for malignant neoplasm of breast: Secondary | ICD-10-CM

## 2024-05-08 ENCOUNTER — Other Ambulatory Visit: Payer: Self-pay | Admitting: Obstetrics and Gynecology

## 2024-05-08 DIAGNOSIS — N6452 Nipple discharge: Secondary | ICD-10-CM

## 2024-05-09 ENCOUNTER — Ambulatory Visit (HOSPITAL_BASED_OUTPATIENT_CLINIC_OR_DEPARTMENT_OTHER): Payer: Self-pay

## 2024-05-10 ENCOUNTER — Encounter: Payer: Self-pay | Admitting: Obstetrics and Gynecology

## 2024-05-25 ENCOUNTER — Ambulatory Visit
Admission: RE | Admit: 2024-05-25 | Discharge: 2024-05-25 | Disposition: A | Discharge status: Home / Self Care | Source: Ambulatory Visit | Attending: Obstetrics and Gynecology | Admitting: Obstetrics and Gynecology

## 2024-05-25 ENCOUNTER — Other Ambulatory Visit: Payer: Self-pay | Admitting: Obstetrics and Gynecology

## 2024-05-25 ENCOUNTER — Ambulatory Visit
Admission: RE | Admit: 2024-05-25 | Discharge: 2024-05-25 | Disposition: A | Payer: Self-pay | Source: Ambulatory Visit | Attending: Obstetrics and Gynecology | Admitting: Obstetrics and Gynecology

## 2024-05-25 ENCOUNTER — Ambulatory Visit
Admission: RE | Admit: 2024-05-25 | Discharge: 2024-05-25 | Disposition: A | Discharge status: Home / Self Care | Payer: Self-pay | Source: Ambulatory Visit | Attending: Obstetrics and Gynecology | Admitting: Obstetrics and Gynecology

## 2024-05-25 DIAGNOSIS — N6452 Nipple discharge: Secondary | ICD-10-CM

## 2024-05-25 DIAGNOSIS — N6310 Unspecified lump in the right breast, unspecified quadrant: Secondary | ICD-10-CM

## 2024-05-26 ENCOUNTER — Encounter: Payer: Self-pay | Admitting: Internal Medicine

## 2024-06-23 ENCOUNTER — Other Ambulatory Visit

## 2024-06-28 ENCOUNTER — Encounter: Payer: Self-pay | Admitting: Physician Assistant

## 2024-07-31 ENCOUNTER — Ambulatory Visit: Admitting: Gastroenterology

## 2024-08-01 ENCOUNTER — Encounter: Payer: Self-pay | Admitting: Physician Assistant

## 2024-08-01 ENCOUNTER — Ambulatory Visit: Admitting: Physician Assistant

## 2024-08-01 VITALS — BP 110/72 | HR 74 | Ht 68.0 in | Wt 185.0 lb

## 2024-08-01 DIAGNOSIS — Z1211 Encounter for screening for malignant neoplasm of colon: Secondary | ICD-10-CM

## 2024-08-01 DIAGNOSIS — R1010 Upper abdominal pain, unspecified: Secondary | ICD-10-CM | POA: Diagnosis not present

## 2024-08-01 DIAGNOSIS — R1011 Right upper quadrant pain: Secondary | ICD-10-CM

## 2024-08-01 DIAGNOSIS — R140 Abdominal distension (gaseous): Secondary | ICD-10-CM

## 2024-08-01 DIAGNOSIS — R1013 Epigastric pain: Secondary | ICD-10-CM

## 2024-08-01 MED ORDER — NA SULFATE-K SULFATE-MG SULF 17.5-3.13-1.6 GM/177ML PO SOLN: 1.0000 | Freq: Once | ORAL | 0 refills | Status: AC

## 2024-08-01 NOTE — Progress Notes (Signed)
 I agree with the assessment and plan as outlined by Ms. Honora.

## 2024-08-01 NOTE — Progress Notes (Signed)
 "     Ellouise Console, PA-C [address] [place], [place]  [address] Phone: [phone]   Gastroenterology Consultation  Referring Provider:     No ref. provider found Primary Care Physician:  Pcp, No Primary Gastroenterologist:  Ellouise Console, PA-C / Dr. Rosario Kidney Reason for Consultation:     Bloating, upper abdominal discomfort, discuss colonoscopy        HPI:   Discussed the use of AI scribe software for clinical note transcription with the patient, who gave verbal consent to proceed.  New patient.  Here to evaluate bloating and discuss scheduling a colonoscopy.  No previous colonoscopy or GI evaluation.  She is due for her first screening colonoscopy.  She denies lower GI symptoms such as diarrhea, constipation, or rectal bleeding.  She is here with her husband today who is interpreting.  She and her husband moved to the [place]  from [place].  Their native language is Arabic.  Patient describes discomfort in the right upper quadrant and mid upper abdomen which comes and goes for many months.  She notes upper abdominal bloating after eating.  Notes increased bowel sounds.  She does not have any severe abdominal pain.  She is requesting to have an abdominal ultrasound and a test to check for H. pylori.  She denies nausea, vomiting, or dysphagia.  Has a rare episode of acid reflux.  No recent abdominal imaging.  Last labs 08/2022 showed normal CBC and CMP.    Past Medical History:  Diagnosis Date   DDD (degenerative disc disease), lumbar     Past Surgical History:  Procedure Laterality Date   TONSILLECTOMY      Prior to Admission medications  Not on File    Family History  Problem Relation Age of Onset   Diabetes Mother      Social History[1]  Allergies as of 08/01/2024   (No Known Allergies)    Review of Systems:    All systems reviewed and negative except where noted in HPI.   Physical Exam:  BP 110/72   Pulse 74   Ht 5' 8 (1.727 m)   Wt 185 lb (83.9 kg)    BMI 28.13 kg/m  No LMP recorded. (Menstrual status: IUD).  General:   Alert,  Well-developed, well-nourished, pleasant and cooperative in NAD Lungs:  Respirations even and unlabored.  Clear throughout to auscultation.   No wheezes, crackles, or rhonchi. No acute distress. Heart:  Regular rate and rhythm; no murmurs, clicks, rubs, or gallops. Abdomen:  Normal bowel sounds.  No bruits.  Soft, and non-distended without masses, hepatosplenomegaly or hernias noted.  No Tenderness.  No guarding or rebound tenderness.    Neurologic:  Alert and oriented x3;  grossly normal neurologically. Psych:  Alert and cooperative. Normal mood and affect.   Imaging Studies: No results found.  Labs: CBC    Component Value Date/Time   WBC 5.6 08/24/2022 1329   WBC 8.7 12/23/2021 2243   RBC 4.84 08/24/2022 1329   RBC 4.54 12/23/2021 2243   HGB 12.9 08/24/2022 1329   HCT 39.4 08/24/2022 1329   PLT 229 08/24/2022 1329   MCV 81 08/24/2022 1329    CMP     Component Value Date/Time   NA 139 08/24/2022 1329   K 3.9 08/24/2022 1329   CL 105 08/24/2022 1329   CO2 20 08/24/2022 1329   GLUCOSE 86 08/24/2022 1329   GLUCOSE 94 12/23/2021 2243   BUN 6 08/24/2022 1329   CREATININE 0.60 08/24/2022  1329   CALCIUM 8.9 08/24/2022 1329   PROT 6.7 08/24/2022 1329   ALBUMIN 4.4 08/24/2022 1329   AST 17 08/24/2022 1329   ALT 16 08/24/2022 1329   ALKPHOS 43 (L) 08/24/2022 1329   BILITOT 0.6 08/24/2022 1329   GFRNONAA >60 12/23/2021 2243    Assessment and Plan:   Tandrea Kommer is a 44 y.o. y/o female has been referred for:   1.  Upper abdominal discomfort with abdominal bloating; no abdominal tenderness on exam today.  Mild and intermittent symptoms. - Ordered Diatherix H. pylori stool test - Ordered complete abdominal ultrasound  2.  Colon cancer screening - Scheduling Colonoscopy I discussed risks of colonoscopy with patient to include risk of bleeding, colon perforation, and risk of sedation.  Patient  expressed understanding and agrees to proceed with colonoscopy.    Follow up based on above test results and GI symptoms.  Ellouise Console, PA-C       [1]  Social History Tobacco Use   Smoking status: Never    Passive exposure: Never   Smokeless tobacco: Never  Vaping Use   Vaping status: Never Used  Substance Use Topics   Alcohol use: Never   Drug use: Never   "

## 2024-08-01 NOTE — Patient Instructions (Addendum)
 You have been scheduled for a colonoscopy. Please follow written instructions given to you at your visit today.   If you use inhalers (even only as needed), please bring them with you on the day of your procedure.  DO NOT TAKE 7 DAYS PRIOR TO TEST- Trulicity (dulaglutide) Ozempic, Wegovy (semaglutide) Mounjaro, Zepbound (tirzepatide) Bydureon Bcise (exanatide extended release)  DO NOT TAKE 1 DAY PRIOR TO YOUR TEST Rybelsus (semaglutide) Adlyxin (lixisenatide) Victoza (liraglutide) Byetta (exanatide) ___________________________________________________________________________   Your provider has ordered [employer]atherix stool testing for you. You have received a kit from our office today containing all necessary supplies to complete this test. Please carefully read the stool collection instructions provided in the kit before opening the accompanying materials. In addition, be sure there is a label providing your full name and date of birth on the puritan opti-swab tube that is supplied in the kit (if you do not see a label with this information on your test tube, please make us  aware before test collection!). After completing the test, you should secure the purtian tube into the specimen biohazard bag. The [facility] E-Req sheet (including date and time of specimen collection) should be placed into the outside pocket of the specimen biohazard bag and returned to the [facility] lab (basement floor of [facility]) within 3 days of collection. Please make sure to give the specimen to a staff member at the lab. DO NOT leave the specimen on the counter.   If the specimen date and time (can be found in the upper right boxed portion of the sheet) are not filled out on the E-Req sheet, the test will NOT be performed.    You will be contacted by [facility] Scheduling in the next 2 days to arrange a Abdominal Ultrasound.  The number on your caller ID will be  [phone], please answer when they call.  If you have not heard from them in 2 days please call [phone] to schedule.   Due to recent changes in healthcare laws, you may see the results of your imaging and laboratory studies on MyChart before your provider has had a chance to review them.  We understand that in some cases there may be results that are confusing or concerning to you. Not all laboratory results come back in the same time frame and the provider may be waiting for multiple results in order to interpret others.  Please give us  48 hours in order for your provider to thoroughly review all the results before contacting the office for clarification of your results.

## 2024-08-04 ENCOUNTER — Ambulatory Visit
Admission: RE | Admit: 2024-08-04 | Discharge: 2024-08-04 | Disposition: A | Source: Ambulatory Visit | Attending: Obstetrics and Gynecology | Admitting: Obstetrics and Gynecology

## 2024-08-04 ENCOUNTER — Other Ambulatory Visit: Payer: Self-pay | Admitting: Obstetrics and Gynecology

## 2024-08-04 DIAGNOSIS — N6310 Unspecified lump in the right breast, unspecified quadrant: Secondary | ICD-10-CM

## 2024-08-04 DIAGNOSIS — N6489 Other specified disorders of breast: Secondary | ICD-10-CM

## 2024-08-09 ENCOUNTER — Telehealth: Payer: Self-pay | Admitting: Physician Assistant

## 2024-08-09 ENCOUNTER — Other Ambulatory Visit: Payer: Self-pay | Admitting: Physician Assistant

## 2024-08-09 DIAGNOSIS — A048 Other specified bacterial intestinal infections: Secondary | ICD-10-CM

## 2024-08-09 MED ORDER — BISMUTH/METRONIDAZ/TETRACYCLIN 140-125-125 MG PO CAPS: 3.0000 | ORAL_CAPSULE | Freq: Three times a day (TID) | ORAL | 0 refills | Status: DC

## 2024-08-09 MED ORDER — OMEPRAZOLE 40 MG PO CPDR: 40.0000 mg | DELAYED_RELEASE_CAPSULE | Freq: Two times a day (BID) | ORAL | 0 refills | Status: AC

## 2024-08-09 NOTE — Addendum Note (Signed)
 Addended by: CLAUDENE NAOMIE SAILOR on: 08/09/2024 01:00 PM   Modules accepted: Orders

## 2024-08-09 NOTE — Telephone Encounter (Signed)
 Positive Diatherix H. pylori test.

## 2024-08-09 NOTE — Telephone Encounter (Signed)
 Please submit for PA

## 2024-08-09 NOTE — Telephone Encounter (Signed)
 I have spoken to patient's husband, Osama to advise of patient's positive H Pylori test which will require treatment with Pylera antibiotic medication x 10 days. Osama verbalizes understanding and has requested that we also send information via patient's mychart.  Advised that Pylera rx has been sent to pharmacy and omeprazole  40 mg twice daily dosing x 10 days.   Advised patient will need to return for repeat stool study for H Pylori around 09/20/24. Lab reminder note to be sent to patient around 09/17/24.

## 2024-08-10 ENCOUNTER — Other Ambulatory Visit (HOSPITAL_COMMUNITY): Payer: Self-pay

## 2024-08-10 NOTE — Telephone Encounter (Signed)
 Contacted [facility] [place], [place] to advise that Pylera must be processed as name brand for insurance coverage. Rx goes through for $4.

## 2024-08-10 NOTE — Telephone Encounter (Signed)
 No PA required, must be processed as name brand

## 2024-09-26 ENCOUNTER — Encounter: Admitting: Internal Medicine

## 2024-10-09 ENCOUNTER — Ambulatory Visit: Payer: Self-pay | Admitting: Dermatology

## 2025-03-20 ENCOUNTER — Other Ambulatory Visit

## 2025-03-20 ENCOUNTER — Encounter
